# Patient Record
Sex: Female | Born: 1950 | Race: White | Hispanic: No | Marital: Married | Smoking: Never smoker
Health system: Southern US, Community
[De-identification: ages and names within clinical notes are randomized; demographics above are authoritative.]

## PROBLEM LIST (undated history)

## (undated) DIAGNOSIS — J189 Pneumonia, unspecified organism: Secondary | ICD-10-CM

## (undated) DIAGNOSIS — M1990 Unspecified osteoarthritis, unspecified site: Secondary | ICD-10-CM

## (undated) DIAGNOSIS — K219 Gastro-esophageal reflux disease without esophagitis: Secondary | ICD-10-CM

## (undated) DIAGNOSIS — C73 Malignant neoplasm of thyroid gland: Secondary | ICD-10-CM

## (undated) DIAGNOSIS — E7800 Pure hypercholesterolemia, unspecified: Secondary | ICD-10-CM

## (undated) DIAGNOSIS — E119 Type 2 diabetes mellitus without complications: Secondary | ICD-10-CM

## (undated) DIAGNOSIS — E039 Hypothyroidism, unspecified: Secondary | ICD-10-CM

## (undated) DIAGNOSIS — F32A Depression, unspecified: Secondary | ICD-10-CM

## (undated) DIAGNOSIS — I10 Essential (primary) hypertension: Secondary | ICD-10-CM

## (undated) HISTORY — DX: Pure hypercholesterolemia, unspecified: E78.00

## (undated) HISTORY — DX: Essential (primary) hypertension: I10

## (undated) HISTORY — PX: THYROIDECTOMY: SHX17

## (undated) HISTORY — DX: Hypothyroidism, unspecified: E03.9

## (undated) HISTORY — DX: Type 2 diabetes mellitus without complications: E11.9

## (undated) HISTORY — DX: Malignant neoplasm of thyroid gland: C73

## (undated) HISTORY — PX: SEPTOPLASTY: SUR1290

---

## 2006-06-19 ENCOUNTER — Ambulatory Visit: Payer: Self-pay | Admitting: Gastroenterology

## 2011-03-08 HISTORY — PX: COLONOSCOPY: SHX5424

## 2011-07-13 ENCOUNTER — Ambulatory Visit: Payer: Self-pay

## 2011-10-25 ENCOUNTER — Ambulatory Visit: Payer: Self-pay | Admitting: Gastroenterology

## 2015-02-06 LAB — HM PAP SMEAR: HM Pap smear: NEGATIVE

## 2016-03-15 LAB — HM MAMMOGRAPHY

## 2017-03-07 HISTORY — PX: COLONOSCOPY: SHX5424

## 2017-03-08 ENCOUNTER — Encounter: Payer: Self-pay | Admitting: Obstetrics and Gynecology

## 2017-03-09 NOTE — Progress Notes (Signed)
This encounter was created in error - please disregard.

## 2017-03-09 NOTE — Patient Instructions (Signed)
I value your feedback and entrusting us with your care. If you get a [place] patient survey, I would appreciate you taking the time to let us know about your experience today. Thank you! 

## 2017-03-28 ENCOUNTER — Encounter: Payer: Self-pay | Admitting: Obstetrics and Gynecology

## 2017-03-28 ENCOUNTER — Ambulatory Visit (INDEPENDENT_AMBULATORY_CARE_PROVIDER_SITE_OTHER): Payer: Medicare Other | Admitting: Obstetrics and Gynecology

## 2017-03-28 VITALS — BP 120/70 | Ht 66.0 in | Wt 153.0 lb

## 2017-03-28 DIAGNOSIS — Z1239 Encounter for other screening for malignant neoplasm of breast: Secondary | ICD-10-CM

## 2017-03-28 DIAGNOSIS — Z1211 Encounter for screening for malignant neoplasm of colon: Secondary | ICD-10-CM

## 2017-03-28 DIAGNOSIS — Z124 Encounter for screening for malignant neoplasm of cervix: Secondary | ICD-10-CM

## 2017-03-28 DIAGNOSIS — Z01419 Encounter for gynecological examination (general) (routine) without abnormal findings: Secondary | ICD-10-CM | POA: Diagnosis not present

## 2017-03-28 DIAGNOSIS — Z1231 Encounter for screening mammogram for malignant neoplasm of breast: Secondary | ICD-10-CM

## 2017-03-28 NOTE — Patient Instructions (Addendum)
I value your feedback and entrusting us with your care. If you get a [place] patient survey, I would appreciate you taking the time to let us know about your experience today. Thank you!  [facility]: [phone]  [facility]: [phone]  

## 2017-03-28 NOTE — Progress Notes (Signed)
PCP: Patient, No Pcp Per   Chief Complaint  Patient presents with  . Gynecologic Exam    HPI:      Ms. Andrea Buckley is a 67 y.o. [id number] who LMP was No LMP recorded., presents today for her annual examination.  Her menses are absent due to menopause. She does not have intermenstrual bleeding. She does not have vasomotor sx.   Sex activity: not sexually active. She does not have vaginal dryness.  Last Pap: February 04, 2015  Results were: no abnormalities  Hx of STDs: none  Last mammogram: March 15, 2016  Results were: normal--routine follow-up in 12 months There is no FH of breast cancer. There is no FH of ovarian cancer. The patient does not do self-breast exams.  Colonoscopy: colonoscopy 6 years ago with abnormalities.  Repeat due after 5 years. Pt is scheduling this yr with PCP.  Tobacco use: The patient denies current or previous tobacco use. Alcohol use: social drinker Exercise: moderately active  She does get adequate calcium and Vitamin D in her diet.  Labs with PCP.   Past Medical History:  Diagnosis Date  . Diabetes type 2, controlled ([place])   . Hypercholesterolemia   . Hypertension   . Hypothyroidism   . Malignant neoplasm of thyroid gland [facility])     Past Surgical History:  Procedure Laterality Date  . COLONOSCOPY  2013   repeat due in 5 yrs  . SEPTOPLASTY      Family History  Problem Relation Age of Onset  . Heart disease Mother   . Diabetes Father   . Diabetes Sister   . Diabetes Brother     Social History   Socioeconomic History  . Marital status: Married    Spouse name: Not on file  . Number of children: Not on file  . Years of education: Not on file  . Highest education level: Not on file  Social Needs  . Financial resource strain: Not on file  . Food insecurity - worry: Not on file  . Food insecurity - inability: Not on file  . Transportation needs - medical: Not on file  . Transportation needs - non-medical: Not on file    Occupational History  . Not on file  Tobacco Use  . Smoking status: Never Smoker  . Smokeless tobacco: Never Used  Substance and Sexual Activity  . Alcohol use: Yes    Frequency: Never  . Drug use: No  . Sexual activity: Not Currently  Other Topics Concern  . Not on file  Social History Narrative  . Not on file    Current Meds  Medication Sig  . cholecalciferol (VITAMIN D) 1000 units tablet Take 1,000 Units by mouth daily.  Marland Kitchen levothyroxine (SYNTHROID, LEVOTHROID) 100 MCG tablet Take 100 mcg by mouth daily before breakfast.  . metFORMIN (GLUCOPHAGE) 1000 MG tablet Take 1,000 mg by mouth 2 (two) times daily with a meal.      ROS:  Review of Systems  Constitutional: Negative for fatigue, fever and unexpected weight change.  Respiratory: Negative for cough, shortness of breath and wheezing.   Cardiovascular: Negative for chest pain, palpitations and leg swelling.  Gastrointestinal: Negative for blood in stool, constipation, diarrhea, nausea and vomiting.  Endocrine: Negative for cold intolerance, heat intolerance and polyuria.  Genitourinary: Negative for dyspareunia, dysuria, flank pain, frequency, genital sores, hematuria, menstrual problem, pelvic pain, urgency, vaginal bleeding, vaginal discharge and vaginal pain.  Musculoskeletal: Negative for back pain, joint swelling and myalgias.  Skin: Negative for rash.  Neurological: Negative for dizziness, syncope, light-headedness, numbness and headaches.  Hematological: Negative for adenopathy.  Psychiatric/Behavioral: Negative for agitation, confusion, sleep disturbance and suicidal ideas. The patient is not nervous/anxious.      Objective: BP 120/70   Ht 5\' 6"  (1.676 m)   Wt 153 lb (69.4 kg)   BMI 24.69 kg/m    Physical Exam  Constitutional: She is oriented to person, place, and time. She appears well-developed and well-nourished.  Genitourinary: Vagina normal and uterus normal. There is no rash or tenderness on the  right labia. There is no rash or tenderness on the left labia. No erythema or tenderness in the vagina. No vaginal discharge found. Right adnexum does not display mass and does not display tenderness. Left adnexum does not display mass and does not display tenderness. Cervix does not exhibit motion tenderness or polyp. Uterus is not enlarged or tender.  Neck: Normal range of motion. No thyromegaly present.  Cardiovascular: Normal rate, regular rhythm and normal heart sounds.  No murmur heard. Pulmonary/Chest: Effort normal and breath sounds normal. Right breast exhibits no mass, no nipple discharge, no skin change and no tenderness. Left breast exhibits no mass, no nipple discharge, no skin change and no tenderness.  Abdominal: Soft. There is no tenderness. There is no guarding.  Musculoskeletal: Normal range of motion.  Neurological: She is alert and oriented to person, place, and time. No cranial nerve deficit.  Psychiatric: She has a normal mood and affect. Her behavior is normal.  Vitals reviewed.   Assessment/Plan:  Encounter for annual routine gynecological examination  Cervical cancer screening - Plan: Pap IG (Image Guided)  Screening for breast cancer - Pt to sched mammo. - Plan: MM DIGITAL SCREENING BILATERAL  Screening for colon cancer - Pt sched scr colonoscopy through PCP.         GYN counsel breast self exam, mammography screening, menopause, adequate intake of calcium and vitamin D, diet and exercise    F/U  Return in about 2 years (around 03/29/2019).  Cyndal Kasson B. Marlisa Caridi, PA-C 03/28/2017 9:02 AM

## 2017-03-30 LAB — PAP IG (IMAGE GUIDED): PAP Smear Comment: 0

## 2017-06-19 ENCOUNTER — Encounter: Payer: Self-pay | Admitting: Obstetrics and Gynecology

## 2017-06-27 ENCOUNTER — Encounter: Payer: Self-pay | Admitting: Obstetrics and Gynecology

## 2018-03-07 DIAGNOSIS — U071 COVID-19: Secondary | ICD-10-CM

## 2018-03-07 HISTORY — DX: COVID-19: U07.1

## 2019-04-24 ENCOUNTER — Other Ambulatory Visit: Payer: Self-pay | Admitting: Family Medicine

## 2019-04-24 DIAGNOSIS — Z1231 Encounter for screening mammogram for malignant neoplasm of breast: Secondary | ICD-10-CM

## 2019-05-22 NOTE — Progress Notes (Signed)
PCP: Patient, No Pcp Per   Chief Complaint  Patient presents with  . Gynecologic Exam    HPI:      Ms. Andrea Buckley is a 69 y.o. [id number] who LMP was No LMP recorded., presents today for her annual examination.  Her menses are absent due to menopause. She does not have intermenstrual bleeding. She does not have vasomotor sx.   Sex activity: rarely sexually active with dyspareunia. She does not have vaginal sx otherwise.  Last Pap: 03/28/17  Results were: no abnormalities  Hx of STDs: none  Last mammogram: 05/26/17  Results were: normal--routine follow-up in 12 months. Has upcoming appt tomorrow. There is no FH of breast cancer. There is no FH of ovarian cancer. The patient does not do self-breast exams.  Colonoscopy: colonoscopy 2019, Repeat due after 5 years per pt report.   Tobacco use: The patient denies current or previous tobacco use. Alcohol use: social drinker  No drug use Exercise: moderately active  She does get adequate calcium and Vitamin D in her diet.  Labs with PCP.   Past Medical History:  Diagnosis Date  . Diabetes type 2, controlled ([place])   . Hypercholesterolemia   . Hypertension   . Hypothyroidism   . Malignant neoplasm of thyroid gland [facility])     Past Surgical History:  Procedure Laterality Date  . COLONOSCOPY  2019   repeat due in 5 yrs  . SEPTOPLASTY      Family History  Problem Relation Age of Onset  . Heart disease Mother   . Diabetes Father   . Diabetes Sister   . Diabetes Brother     Social History   Socioeconomic History  . Marital status: Married    Spouse name: Not on file  . Number of children: Not on file  . Years of education: Not on file  . Highest education level: Not on file  Occupational History  . Not on file  Tobacco Use  . Smoking status: Never Smoker  . Smokeless tobacco: Never Used  Substance and Sexual Activity  . Alcohol use: Yes  . Drug use: No  . Sexual activity: Yes  Other Topics Concern  . Not on  file  Social History Narrative  . Not on file   Social Determinants of Health   Financial Resource Strain:   . Difficulty of Paying Living Expenses:   Food Insecurity:   . Worried About Charity fundraiser in the Last Year:   . Arboriculturist in the Last Year:   Transportation Needs:   . Film/video editor (Medical):   Marland Kitchen Lack of Transportation (Non-Medical):   Physical Activity:   . Days of Exercise per Week:   . Minutes of Exercise per Session:   Stress:   . Feeling of Stress :   Social Connections:   . Frequency of Communication with Friends and Family:   . Frequency of Social Gatherings with Friends and Family:   . Attends Religious Services:   . Active Member of Clubs or Organizations:   . Attends Archivist Meetings:   Marland Kitchen Marital Status:   Intimate Partner Violence:   . Fear of Current or Ex-Partner:   . Emotionally Abused:   Marland Kitchen Physically Abused:   . Sexually Abused:     Current Meds  Medication Sig  . ascorbic acid (VITAMIN C) 100 MG tablet Take by mouth.  Marland Kitchen BIOTIN PO Take by mouth.  . Blood Glucose Monitoring Suppl (  ACCU-CHEK AVIVA PLUS) w/Device KIT Use to test blood sugar  . cholecalciferol (VITAMIN D) 1000 units tablet Take 1,000 Units by mouth daily.  . Coenzyme Q10 (COQ10 PO) Take by mouth.  . cyanocobalamin 1000 MCG tablet Take by mouth.  . levothyroxine (SYNTHROID, LEVOTHROID) 100 MCG tablet Take 100 mcg by mouth daily before breakfast.  . lisinopril (ZESTRIL) 10 MG tablet Take by mouth.  . metFORMIN (GLUCOPHAGE) 1000 MG tablet Take 1,000 mg by mouth 2 (two) times daily with a meal.  . Multiple Vitamin (MULTI-VITAMIN) tablet Take by mouth.  . rosuvastatin (CRESTOR) 10 MG tablet Take by mouth.      ROS:  Review of Systems  Constitutional: Negative for fatigue, fever and unexpected weight change.  Respiratory: Negative for cough, shortness of breath and wheezing.   Cardiovascular: Negative for chest pain, palpitations and leg swelling.    Gastrointestinal: Positive for diarrhea. Negative for blood in stool, constipation, nausea and vomiting.  Endocrine: Negative for cold intolerance, heat intolerance and polyuria.  Genitourinary: Positive for dyspareunia. Negative for dysuria, flank pain, frequency, genital sores, hematuria, menstrual problem, pelvic pain, urgency, vaginal bleeding, vaginal discharge and vaginal pain.  Musculoskeletal: Negative for back pain, joint swelling and myalgias.  Skin: Negative for rash.  Neurological: Negative for dizziness, syncope, light-headedness, numbness and headaches.  Hematological: Negative for adenopathy.  Psychiatric/Behavioral: Negative for agitation, confusion, sleep disturbance and suicidal ideas. The patient is not nervous/anxious.      Objective: BP 130/70   Ht 5' 6"  (1.676 m)   Wt 158 lb (71.7 kg)   BMI 25.50 kg/m    Physical Exam Constitutional:      Appearance: She is well-developed.  Genitourinary:     Vulva, vagina, uterus, right adnexa and left adnexa normal.     No vulval lesion or tenderness noted.     Vaginal atrophic mucosa present.     No vaginal discharge, erythema or tenderness.     No cervical motion tenderness or polyp.     Uterus is not enlarged or tender.     No right or left adnexal mass present.     Right adnexa not tender.     Left adnexa not tender.  Neck:     Thyroid: No thyromegaly.  Cardiovascular:     Rate and Rhythm: Normal rate and regular rhythm.     Heart sounds: Normal heart sounds. No murmur.  Pulmonary:     Effort: Pulmonary effort is normal.     Breath sounds: Normal breath sounds.  Chest:     Breasts:        Right: No mass, nipple discharge, skin change or tenderness.        Left: No mass, nipple discharge, skin change or tenderness.  Abdominal:     Palpations: Abdomen is soft.     Tenderness: There is no abdominal tenderness. There is no guarding.  Musculoskeletal:        General: Normal range of motion.     Cervical back:  Normal range of motion.  Neurological:     General: No focal deficit present.     Mental Status: She is alert and oriented to person, place, and time.     Cranial Nerves: No cranial nerve deficit.  Skin:    General: Skin is warm and dry.  Psychiatric:        Mood and Affect: Mood normal.        Behavior: Behavior normal.        Thought Content:  Thought content normal.        Judgment: Judgment normal.  Vitals reviewed.     Assessment/Plan:  Encounter for annual routine gynecological examination  Cervical cancer screening - Plan: Cytology - PAP  Encounter for screening mammogram for malignant neoplasm of breast; pt has appt tomorrow  Screening for colon cancer--UTD on colonoscopy.         GYN counsel breast self exam, mammography screening, menopause, adequate intake of calcium and vitamin D, diet and exercise    F/U  Return in about 2 years (around 05/22/2021). if pt desires; prn.  Pernell Lenoir B. Kemani Heidel, PA-C 05/23/2019 11:21 AM

## 2019-05-23 ENCOUNTER — Encounter: Payer: Self-pay | Admitting: Obstetrics and Gynecology

## 2019-05-23 ENCOUNTER — Other Ambulatory Visit: Payer: Self-pay

## 2019-05-23 ENCOUNTER — Other Ambulatory Visit (HOSPITAL_COMMUNITY)
Admission: RE | Admit: 2019-05-23 | Discharge: 2019-05-23 | Disposition: A | Discharge status: Home / Self Care | Payer: Medicare HMO | Source: Ambulatory Visit | Attending: Obstetrics and Gynecology | Admitting: Obstetrics and Gynecology

## 2019-05-23 ENCOUNTER — Ambulatory Visit (INDEPENDENT_AMBULATORY_CARE_PROVIDER_SITE_OTHER): Payer: Medicare HMO | Admitting: Obstetrics and Gynecology

## 2019-05-23 VITALS — BP 130/70 | Ht 66.0 in | Wt 158.0 lb

## 2019-05-23 DIAGNOSIS — Z1211 Encounter for screening for malignant neoplasm of colon: Secondary | ICD-10-CM

## 2019-05-23 DIAGNOSIS — Z124 Encounter for screening for malignant neoplasm of cervix: Secondary | ICD-10-CM

## 2019-05-23 DIAGNOSIS — Z01419 Encounter for gynecological examination (general) (routine) without abnormal findings: Secondary | ICD-10-CM

## 2019-05-23 DIAGNOSIS — Z1231 Encounter for screening mammogram for malignant neoplasm of breast: Secondary | ICD-10-CM

## 2019-05-23 NOTE — Patient Instructions (Signed)
I value your feedback and entrusting us with your care. If you get a [place] patient survey, I would appreciate you taking the time to let us know about your experience today. Thank you!  As of February 14, 2019, your lab results will be released to your MyChart immediately, before I even have a chance to see them. Please give me time to review them and contact you if there are any abnormalities. Thank you for your patience.  

## 2019-05-24 ENCOUNTER — Ambulatory Visit
Admission: RE | Admit: 2019-05-24 | Discharge: 2019-05-24 | Disposition: A | Discharge status: Home / Self Care | Payer: Medicare HMO | Source: Ambulatory Visit | Attending: Family Medicine | Admitting: Family Medicine

## 2019-05-24 DIAGNOSIS — Z1231 Encounter for screening mammogram for malignant neoplasm of breast: Secondary | ICD-10-CM | POA: Insufficient documentation

## 2019-05-27 LAB — CYTOLOGY - PAP: Diagnosis: NEGATIVE

## 2020-02-26 ENCOUNTER — Other Ambulatory Visit: Payer: Self-pay | Admitting: Physician Assistant

## 2020-02-26 DIAGNOSIS — M12812 Other specific arthropathies, not elsewhere classified, left shoulder: Secondary | ICD-10-CM

## 2020-02-26 DIAGNOSIS — M75102 Unspecified rotator cuff tear or rupture of left shoulder, not specified as traumatic: Secondary | ICD-10-CM

## 2020-02-26 DIAGNOSIS — M7502 Adhesive capsulitis of left shoulder: Secondary | ICD-10-CM

## 2020-03-31 ENCOUNTER — Ambulatory Visit: Payer: Medicare HMO

## 2020-03-31 ENCOUNTER — Ambulatory Visit
Admission: RE | Admit: 2020-03-31 | Discharge: 2020-03-31 | Disposition: A | Discharge status: Home / Self Care | Payer: Medicare HMO | Source: Ambulatory Visit | Attending: Physician Assistant | Admitting: Physician Assistant

## 2020-03-31 ENCOUNTER — Other Ambulatory Visit: Payer: Self-pay

## 2020-03-31 DIAGNOSIS — M12812 Other specific arthropathies, not elsewhere classified, left shoulder: Secondary | ICD-10-CM | POA: Diagnosis present

## 2020-03-31 DIAGNOSIS — M75102 Unspecified rotator cuff tear or rupture of left shoulder, not specified as traumatic: Secondary | ICD-10-CM

## 2020-03-31 DIAGNOSIS — M7502 Adhesive capsulitis of left shoulder: Secondary | ICD-10-CM | POA: Diagnosis present

## 2020-06-08 ENCOUNTER — Telehealth: Payer: Self-pay

## 2020-06-08 DIAGNOSIS — Z1231 Encounter for screening mammogram for malignant neoplasm of breast: Secondary | ICD-10-CM

## 2020-06-08 NOTE — Addendum Note (Signed)
Addended by: Ardeth Perfect B on: 10/11[birth year]1957 03:55 PM   Modules accepted: Orders

## 2020-06-08 NOTE — Telephone Encounter (Signed)
Pt calling; states [facility] said it was a dx mammogram; pt is confused - doesn't think there is a problem; maybe she doesn't need a mammogram like she doesn't need a pap anymore d/t age.  [phone]

## 2020-06-08 NOTE — Telephone Encounter (Signed)
Sry, I put in wrong order. Fixed now, pls notify pt and apologize for my error. Thx.

## 2020-06-08 NOTE — Telephone Encounter (Signed)
Order placed. Pls notify pt.

## 2020-06-08 NOTE — Telephone Encounter (Signed)
Pt aware.

## 2020-06-08 NOTE — Telephone Encounter (Signed)
This encounter was created in error - please disregard.

## 2020-06-08 NOTE — Telephone Encounter (Signed)
Pt calling; needs mammogram; on AVS recv'd from [facility] it says mammogram screening digital bilateral expires 4/16.  Pt calling to get scheduled.  [phone]

## 2020-07-02 ENCOUNTER — Other Ambulatory Visit: Payer: Self-pay | Admitting: Surgery

## 2020-07-15 ENCOUNTER — Encounter
Admission: RE | Admit: 2020-07-15 | Discharge: 2020-07-15 | Disposition: A | Discharge status: Home / Self Care | Payer: Medicare HMO | Source: Ambulatory Visit | Attending: Surgery | Admitting: Surgery

## 2020-07-15 ENCOUNTER — Other Ambulatory Visit: Payer: Self-pay

## 2020-07-15 DIAGNOSIS — Z01818 Encounter for other preprocedural examination: Secondary | ICD-10-CM | POA: Insufficient documentation

## 2020-07-15 HISTORY — DX: Unspecified osteoarthritis, unspecified site: M19.90

## 2020-07-15 HISTORY — DX: Gastro-esophageal reflux disease without esophagitis: K21.9

## 2020-07-15 NOTE — Patient Instructions (Signed)
Your procedure is scheduled on:07-23-20 THURSDAY Report to the Registration Desk on the 1st floor of the [facility]-Then proceed to the 2nd floor Surgery Desk in the [place] To find out your arrival time, please call [phone] between 1PM - 3PM on:07-22-20 WEDNESDAY  REMEMBER: Instructions that are not followed completely may result in serious medical risk, up to and including death; or upon the discretion of your surgeon and anesthesiologist your surgery may need to be rescheduled.  Do not eat food after midnight the night before surgery.  No gum chewing, lozengers or hard candies.  You may however, drink WATER up to 2 hours before you are scheduled to arrive for your surgery. Do not drink anything within 2 hours of your scheduled arrival time.  Type 1 and Type 2 diabetics should only drink water  TAKE THESE MEDICATIONS THE MORNING OF SURGERY WITH A SIP OF WATER: -SYNTHROID (LEVOTHYROXINE)  Stop Metformin 2 days prior to surgery-LAST DOSE ON 07-20-20 MONDAY  One week prior to surgery: Stop Anti-inflammatories (NSAIDS) such as Advil, Aleve, Ibuprofen, Motrin, Naproxen, Naprosyn and Aspirin based products such as Excedrin, Goodys Powder, BC Powder-OK TO TAKE TYLENOL IF NEEDED   Stop ANY OVER THE COUNTER supplements/vitamins NOW (07-15-20) until after surgery.  No Alcohol for 24 hours before or after surgery.  No Smoking including e-cigarettes for 24 hours prior to surgery.  No chewable tobacco products for at least 6 hours prior to surgery.  No nicotine patches on the day of surgery.  Do not use any "recreational" drugs for at least a week prior to your surgery.  Please be advised that the combination of cocaine and anesthesia may have negative outcomes, up to and including death. If you test positive for cocaine, your surgery will be cancelled.  On the morning of surgery brush your teeth with toothpaste and water, you may rinse your mouth with mouthwash if you wish. Do not  swallow any toothpaste or mouthwash.  Do not wear jewelry, make-up, hairpins, clips or nail polish.  Do not wear lotions, powders, or perfumes.   Do not shave body from the neck down 48 hours prior to surgery just in case you cut yourself which could leave a site for infection.  Also, freshly shaved skin may become irritated if using the CHG soap.  Contact lenses, hearing aids and dentures may not be worn into surgery.  Do not bring valuables to the hospital. [facility] is not responsible for any missing/lost belongings or valuables.   Use CHG Soap as directed on instruction sheet  Notify your doctor if there is any change in your medical condition (cold, fever, infection).  Wear comfortable clothing (specific to your surgery type) to the hospital.  Plan for stool softeners for home use; pain medications have a tendency to cause constipation. You can also help prevent constipation by eating foods high in fiber such as fruits and vegetables and drinking plenty of fluids as your diet allows.  After surgery, you can help prevent lung complications by doing breathing exercises.  Take deep breaths and cough every 1-2 hours. Your doctor may order a device called an Incentive Spirometer to help you take deep breaths. When coughing or sneezing, hold a pillow firmly against your incision with both hands. This is called "splinting." Doing this helps protect your incision. It also decreases belly discomfort.  If you are being admitted to the hospital overnight, leave your suitcase in the car. After surgery it may be brought to your room.  If you are being discharged the day of surgery, you will not be allowed to drive home. You will need a responsible adult (18 years or older) to drive you home and stay with you that night.   If you are taking public transportation, you will need to have a responsible adult (18 years or older) with you. Please confirm with your physician that it is acceptable  to use public transportation.   Please call the [place] Dept. at [phone] if you have any questions about these instructions.  Surgery Visitation Policy:  Patients undergoing a surgery or procedure may have one family member or support person with them as long as that person is not COVID-19 positive or experiencing its symptoms.  That person may remain in the waiting area during the procedure.  Inpatient Visitation:    Visiting hours are 7 a.m. to 8 p.m. Inpatients will be allowed two visitors daily. The visitors may change each day during the patient's stay. No visitors under the age of 74. Any visitor under the age of 7 must be accompanied by an adult. The visitor must pass COVID-19 screenings, use hand sanitizer when entering and exiting the patient's room and wear a mask at all times, including in the patient's room. Patients must also wear a mask when staff or their visitor are in the room. Masking is required regardless of vaccination status.

## 2020-07-16 ENCOUNTER — Encounter
Admission: RE | Admit: 2020-07-16 | Discharge: 2020-07-16 | Disposition: A | Discharge status: Home / Self Care | Payer: Medicare HMO | Source: Ambulatory Visit | Attending: Surgery | Admitting: Surgery

## 2020-07-16 DIAGNOSIS — Z01818 Encounter for other preprocedural examination: Secondary | ICD-10-CM | POA: Diagnosis not present

## 2020-07-16 DIAGNOSIS — I10 Essential (primary) hypertension: Secondary | ICD-10-CM

## 2020-07-16 LAB — HEMOGLOBIN A1C
Hgb A1c MFr Bld: 8.4 % — ABNORMAL HIGH (ref 4.8–5.6)
Mean Plasma Glucose: 194.38 mg/dL

## 2020-07-17 NOTE — Progress Notes (Signed)
  Perioperative Services: Pre-Admission/Anesthesia Testing  Abnormal Lab Notification    Date: 07/17/20  Name: Andrea Buckley MRN:   [id number]  Re: Abnormal labs noted during PAT appointment   Provider(s) Notified: Milagros Evener, MD Notification mode: Routed and/or faxed via [facility]   ABNORMAL LAB VALUE(S): Lab Results  Component Value Date   HGBA1C 8.4 (H) 07/16/2020   Notes: Patient with a T2DM diagnosis. She is currently metformin monotherpy. This communication is being sent in order to determine if patient is deemed to have adequate preoperative glycemic control in efforts to reduce her risk of developing SSI.  Marland Kitchen The odds ratio for SSI/PJI infection is between 2.8 and 3.4 for orthopedic surgery patients with pre-operative serum glucose levels of > 125 mg/dL or a post-operative levels of > 200 mg/dL ([place], 2019).    . Data suggests that a Hgb A1c threshold of 7.7% tends to be more indicative of infection than the commonly used 7% and should perhaps be the pre-operative patient optimization goal (Tarabichi et al., 2017).   With that being said, the benefit of improving glycemic control must be weighed against the overall risk associated with delaying a necessary elective orthopedic surgery for this patient. This is a Community education officer; no formal response is required.  Citations: Charlett Blake, A.F. Reducing the risk of infection after total joint arthroplasty: preoperative optimization. Arthroplasty 1, 4 (2019). [email or web address]  Lorrin Goodell MM, [place], Brigati D, [place], [address] [place] JC, [place], [place], [place], [place]. Determining the Threshold for HbA1c as a Predictor for Adverse Outcomes After Total Joint Arthroplasty: A Multicenter, Retrospective Study. J Arthroplasty. 2017 Sep;32(9S):S263-S267.e1. [email or web address].   Honor Loh, MSN, APRN, FNP-C, CEN [facility]  Peri-operative Services Nurse Practitioner Phone: [phone] 07/17/20 8:14 AM

## 2020-07-21 ENCOUNTER — Other Ambulatory Visit: Payer: Medicare HMO

## 2020-07-22 ENCOUNTER — Encounter: Payer: Self-pay | Admitting: Obstetrics and Gynecology

## 2020-07-22 ENCOUNTER — Ambulatory Visit
Admission: RE | Admit: 2020-07-22 | Discharge: 2020-07-22 | Disposition: A | Discharge status: Home / Self Care | Payer: Medicare HMO | Source: Ambulatory Visit | Attending: Obstetrics and Gynecology | Admitting: Obstetrics and Gynecology

## 2020-07-22 ENCOUNTER — Other Ambulatory Visit: Payer: Self-pay

## 2020-07-22 DIAGNOSIS — Z1231 Encounter for screening mammogram for malignant neoplasm of breast: Secondary | ICD-10-CM | POA: Diagnosis not present

## 2020-07-23 ENCOUNTER — Other Ambulatory Visit: Payer: Self-pay

## 2020-07-23 ENCOUNTER — Ambulatory Visit
Admission: RE | Admit: 2020-07-23 | Discharge: 2020-07-23 | Disposition: A | Discharge status: Home / Self Care | Payer: Medicare HMO | Attending: Surgery | Admitting: Surgery

## 2020-07-23 ENCOUNTER — Ambulatory Visit: Payer: Medicare HMO | Admitting: Anesthesiology

## 2020-07-23 ENCOUNTER — Encounter: Payer: Self-pay | Admitting: Surgery

## 2020-07-23 ENCOUNTER — Encounter
Admission: RE | Disposition: A | Discharge status: Home / Self Care | Payer: Self-pay | Source: Home / Self Care | Attending: Surgery

## 2020-07-23 ENCOUNTER — Ambulatory Visit: Payer: Medicare HMO

## 2020-07-23 DIAGNOSIS — Z7984 Long term (current) use of oral hypoglycemic drugs: Secondary | ICD-10-CM | POA: Insufficient documentation

## 2020-07-23 DIAGNOSIS — Z79890 Hormone replacement therapy (postmenopausal): Secondary | ICD-10-CM | POA: Diagnosis not present

## 2020-07-23 DIAGNOSIS — M75112 Incomplete rotator cuff tear or rupture of left shoulder, not specified as traumatic: Secondary | ICD-10-CM | POA: Diagnosis not present

## 2020-07-23 DIAGNOSIS — M67814 Other specified disorders of tendon, left shoulder: Secondary | ICD-10-CM | POA: Insufficient documentation

## 2020-07-23 DIAGNOSIS — M7502 Adhesive capsulitis of left shoulder: Secondary | ICD-10-CM | POA: Insufficient documentation

## 2020-07-23 DIAGNOSIS — M25512 Pain in left shoulder: Secondary | ICD-10-CM

## 2020-07-23 DIAGNOSIS — Z79899 Other long term (current) drug therapy: Secondary | ICD-10-CM | POA: Insufficient documentation

## 2020-07-23 DIAGNOSIS — Z85850 Personal history of malignant neoplasm of thyroid: Secondary | ICD-10-CM | POA: Diagnosis not present

## 2020-07-23 DIAGNOSIS — M7542 Impingement syndrome of left shoulder: Secondary | ICD-10-CM | POA: Diagnosis not present

## 2020-07-23 HISTORY — PX: SHOULDER ARTHROSCOPY: SHX128

## 2020-07-23 LAB — GLUCOSE, CAPILLARY
Glucose-Capillary: 142 mg/dL — ABNORMAL HIGH (ref 70–99)
Glucose-Capillary: 167 mg/dL — ABNORMAL HIGH (ref 70–99)

## 2020-07-23 SURGERY — ARTHROSCOPY, SHOULDER
Anesthesia: General | Site: Shoulder | Laterality: Left

## 2020-07-23 MED ORDER — SUGAMMADEX SODIUM 200 MG/2ML IV SOLN
INTRAVENOUS | Status: DC | PRN
  Administered 2020-07-23: 500 mg via INTRAVENOUS

## 2020-07-23 MED ORDER — SODIUM CHLORIDE 0.9 % IV SOLN: INTRAVENOUS | Status: DC

## 2020-07-23 MED ORDER — POTASSIUM CHLORIDE IN NACL 20-0.9 MEQ/L-% IV SOLN: INTRAVENOUS | Status: DC

## 2020-07-23 MED ORDER — PHENYLEPHRINE HCL (PRESSORS) 10 MG/ML IV SOLN
INTRAVENOUS | Status: DC | PRN
  Administered 2020-07-23 (×2): 100 ug via INTRAVENOUS

## 2020-07-23 MED ORDER — FENTANYL CITRATE (PF) 100 MCG/2ML IJ SOLN: 50.0000 ug | Freq: Once | INTRAMUSCULAR | Status: DC

## 2020-07-23 MED ORDER — ONDANSETRON HCL 4 MG/2ML IJ SOLN
INTRAMUSCULAR | Status: AC
  Filled 2020-07-23: qty 2

## 2020-07-23 MED ORDER — DEXAMETHASONE SODIUM PHOSPHATE 10 MG/ML IJ SOLN
INTRAMUSCULAR | Status: AC
  Filled 2020-07-23: qty 1

## 2020-07-23 MED ORDER — EPINEPHRINE PF 1 MG/ML IJ SOLN
INTRAMUSCULAR | Status: AC
  Filled 2020-07-23: qty 1

## 2020-07-23 MED ORDER — METOCLOPRAMIDE HCL 10 MG PO TABS: 5.0000 mg | ORAL_TABLET | Freq: Three times a day (TID) | ORAL | Status: DC | PRN

## 2020-07-23 MED ORDER — ONDANSETRON HCL 4 MG/2ML IJ SOLN
INTRAMUSCULAR | Status: DC | PRN
  Administered 2020-07-23: 4 mg via INTRAVENOUS

## 2020-07-23 MED ORDER — MIDAZOLAM HCL 2 MG/2ML IJ SOLN: 1.0000 mg | Freq: Once | INTRAMUSCULAR | Status: DC

## 2020-07-23 MED ORDER — ONDANSETRON HCL 4 MG PO TABS: 4.0000 mg | ORAL_TABLET | Freq: Four times a day (QID) | ORAL | Status: DC | PRN

## 2020-07-23 MED ORDER — BUPIVACAINE LIPOSOME 1.3 % IJ SUSP
INTRAMUSCULAR | Status: DC | PRN
  Administered 2020-07-23: 20 mL

## 2020-07-23 MED ORDER — BUPIVACAINE-EPINEPHRINE (PF) 0.5% -1:200000 IJ SOLN
INTRAMUSCULAR | Status: AC
  Filled 2020-07-23: qty 30

## 2020-07-23 MED ORDER — ONDANSETRON HCL 4 MG/2ML IJ SOLN: 4.0000 mg | Freq: Four times a day (QID) | INTRAMUSCULAR | Status: DC | PRN

## 2020-07-23 MED ORDER — DEXAMETHASONE SODIUM PHOSPHATE 10 MG/ML IJ SOLN
INTRAMUSCULAR | Status: DC | PRN
  Administered 2020-07-23: 5 mg via INTRAVENOUS

## 2020-07-23 MED ORDER — BUPIVACAINE HCL (PF) 0.5 % IJ SOLN
INTRAMUSCULAR | Status: DC | PRN
  Administered 2020-07-23: 10 mL

## 2020-07-23 MED ORDER — FENTANYL CITRATE (PF) 100 MCG/2ML IJ SOLN
INTRAMUSCULAR | Status: DC | PRN
  Administered 2020-07-23: 25 ug via INTRAVENOUS

## 2020-07-23 MED ORDER — CEFAZOLIN SODIUM-DEXTROSE 2-4 GM/100ML-% IV SOLN
2.0000 g | INTRAVENOUS | Status: AC
  Administered 2020-07-23: 2 g via INTRAVENOUS

## 2020-07-23 MED ORDER — FENTANYL CITRATE (PF) 100 MCG/2ML IJ SOLN
INTRAMUSCULAR | Status: AC
  Filled 2020-07-23: qty 2

## 2020-07-23 MED ORDER — SODIUM CHLORIDE 0.9 % IV SOLN
INTRAVENOUS | Status: DC | PRN
  Administered 2020-07-23: 40 ug/min via INTRAVENOUS

## 2020-07-23 MED ORDER — METOCLOPRAMIDE HCL 5 MG/ML IJ SOLN
5.0000 mg | Freq: Three times a day (TID) | INTRAMUSCULAR | Status: DC | PRN
Start: 2020-07-23 — End: 2020-07-23

## 2020-07-23 MED ORDER — PROPOFOL 10 MG/ML IV BOLUS
INTRAVENOUS | Status: DC | PRN
  Administered 2020-07-23: 25 mg via INTRAVENOUS
  Administered 2020-07-23: 50 mg via INTRAVENOUS

## 2020-07-23 MED ORDER — OXYCODONE HCL 5 MG PO TABS: 5.0000 mg | ORAL_TABLET | ORAL | 0 refills | Status: DC | PRN

## 2020-07-23 MED ORDER — ROCURONIUM BROMIDE 100 MG/10ML IV SOLN
INTRAVENOUS | Status: DC | PRN
  Administered 2020-07-23: 40 mg via INTRAVENOUS
  Administered 2020-07-23: 20 mg via INTRAVENOUS

## 2020-07-23 MED ORDER — ORAL CARE MOUTH RINSE: 15.0000 mL | Freq: Once | OROMUCOSAL | Status: DC

## 2020-07-23 MED ORDER — LIDOCAINE HCL (PF) 1 % IJ SOLN
INTRAMUSCULAR | Status: AC
  Filled 2020-07-23: qty 5

## 2020-07-23 MED ORDER — BUPIVACAINE LIPOSOME 1.3 % IJ SUSP
INTRAMUSCULAR | Status: AC
  Filled 2020-07-23: qty 20

## 2020-07-23 MED ORDER — CHLORHEXIDINE GLUCONATE 0.12 % MT SOLN: 15.0000 mL | Freq: Once | OROMUCOSAL | Status: DC

## 2020-07-23 MED ORDER — CEFAZOLIN SODIUM-DEXTROSE 2-4 GM/100ML-% IV SOLN
INTRAVENOUS | Status: AC
  Filled 2020-07-23: qty 100

## 2020-07-23 MED ORDER — MIDAZOLAM HCL 2 MG/2ML IJ SOLN
INTRAMUSCULAR | Status: AC
  Filled 2020-07-23: qty 2

## 2020-07-23 MED ORDER — ACETAMINOPHEN 10 MG/ML IV SOLN
INTRAVENOUS | Status: DC | PRN
  Administered 2020-07-23: 1000 mg via INTRAVENOUS

## 2020-07-23 MED ORDER — BUPIVACAINE HCL (PF) 0.5 % IJ SOLN
INTRAMUSCULAR | Status: AC
  Filled 2020-07-23: qty 10

## 2020-07-23 MED ORDER — LIDOCAINE HCL (CARDIAC) PF 100 MG/5ML IV SOSY
PREFILLED_SYRINGE | INTRAVENOUS | Status: DC | PRN
  Administered 2020-07-23: 60 mg via INTRAVENOUS

## 2020-07-23 MED ORDER — BUPIVACAINE-EPINEPHRINE (PF) 0.5% -1:200000 IJ SOLN
INTRAMUSCULAR | Status: DC | PRN
  Administered 2020-07-23: 30 mL

## 2020-07-23 MED ORDER — EPHEDRINE SULFATE 50 MG/ML IJ SOLN
INTRAMUSCULAR | Status: DC | PRN
  Administered 2020-07-23 (×2): 5 mg via INTRAVENOUS

## 2020-07-23 MED ORDER — OXYCODONE HCL 5 MG PO TABS
5.0000 mg | ORAL_TABLET | ORAL | Status: DC | PRN
Start: 2020-07-23 — End: 2020-07-23

## 2020-07-23 SURGICAL SUPPLY — 50 items
ANCH SUT 2 2.9 2 LD TPR NDL (Anchor) ×1 IMPLANT
ANCH SUT BN ASCP DLV (Anchor) ×1 IMPLANT
ANCH SUT RGNRT REGENETEN (Staple) ×1 IMPLANT
ANCHOR BONE REGENETEN (Anchor) ×2 IMPLANT
ANCHOR JUGGERKNOT WTAP NDL 2.9 (Anchor) ×2 IMPLANT
ANCHOR TENDON REGENETEN (Staple) ×2 IMPLANT
APL PRP STRL LF DISP 70% ISPRP (MISCELLANEOUS) ×1
BIT DRILL JUGRKNT W/NDL BIT2.9 (DRILL) ×1 IMPLANT
BLADE FULL RADIUS 3.5 (BLADE) ×2 IMPLANT
BUR ACROMIONIZER 4.0 (BURR) ×2 IMPLANT
CANNULA SHAVER 8MMX76MM (CANNULA) ×2 IMPLANT
CHLORAPREP W/TINT 26 (MISCELLANEOUS) ×2 IMPLANT
COVER MAYO STAND REUSABLE (DRAPES) ×2 IMPLANT
COVER WAND RF STERILE (DRAPES) ×2 IMPLANT
DRAPE IMP U-DRAPE 54X76 (DRAPES) ×4 IMPLANT
DRILL JUGGERKNOT W/NDL BIT 2.9 (DRILL) ×2
ELECT REM PT RETURN 9FT ADLT (ELECTROSURGICAL) ×2
ELECTRODE REM PT RTRN 9FT ADLT (ELECTROSURGICAL) ×1 IMPLANT
GAUZE SPONGE 4X4 12PLY STRL (GAUZE/BANDAGES/DRESSINGS) ×2 IMPLANT
GAUZE XEROFORM 1X8 LF (GAUZE/BANDAGES/DRESSINGS) ×2 IMPLANT
GLOVE SRG 8 PF TXTR STRL LF DI (GLOVE) ×1 IMPLANT
GLOVE SURG ENC MOIS LTX SZ7.5 (GLOVE) ×4 IMPLANT
GLOVE SURG ENC MOIS LTX SZ8 (GLOVE) ×4 IMPLANT
GLOVE SURG UNDER LTX SZ8 (GLOVE) ×2 IMPLANT
GLOVE SURG UNDER POLY LF SZ8 (GLOVE) ×2
GOWN STRL REUS W/ TWL LRG LVL3 (GOWN DISPOSABLE) ×1 IMPLANT
GOWN STRL REUS W/ TWL XL LVL3 (GOWN DISPOSABLE) ×1 IMPLANT
GOWN STRL REUS W/TWL LRG LVL3 (GOWN DISPOSABLE) ×2
GOWN STRL REUS W/TWL XL LVL3 (GOWN DISPOSABLE) ×2
GRASPER SUT 15 45D LOW PRO (SUTURE) IMPLANT
IMPL REGENETEN MEDIUM (Shoulder) ×1 IMPLANT
IMPLANT REGENETEN MEDIUM (Shoulder) ×2 IMPLANT
IV LACTATED RINGER IRRG 3000ML (IV SOLUTION) ×2
IV LR IRRIG 3000ML ARTHROMATIC (IV SOLUTION) ×1 IMPLANT
MANIFOLD NEPTUNE II (INSTRUMENTS) ×4 IMPLANT
MASK FACE SPIDER DISP (MASK) ×2 IMPLANT
MAT ABSORB  FLUID 56X50 GRAY (MISCELLANEOUS) ×1
MAT ABSORB FLUID 56X50 GRAY (MISCELLANEOUS) ×1 IMPLANT
PACK ARTHROSCOPY SHOULDER (MISCELLANEOUS) ×2 IMPLANT
PENCIL SMOKE EVACUATOR (MISCELLANEOUS) ×2 IMPLANT
SLING ARM LRG DEEP (SOFTGOODS) IMPLANT
SLING ULTRA II LG (MISCELLANEOUS) IMPLANT
STAPLER SKIN PROX 35W (STAPLE) ×2 IMPLANT
STRAP SAFETY 5IN WIDE (MISCELLANEOUS) ×2 IMPLANT
SUT ETHIBOND 0 MO6 C/R (SUTURE) ×2 IMPLANT
SUT VIC AB 2-0 CT1 27 (SUTURE) ×4
SUT VIC AB 2-0 CT1 TAPERPNT 27 (SUTURE) ×2 IMPLANT
TAPE MICROFOAM 4IN (TAPE) ×2 IMPLANT
TUBING ARTHRO INFLOW-ONLY STRL (TUBING) ×2 IMPLANT
WAND WEREWOLF FLOW 90D (MISCELLANEOUS) ×2 IMPLANT

## 2020-07-23 NOTE — Anesthesia Preprocedure Evaluation (Addendum)
Anesthesia Evaluation  Patient identified by MRN, date of birth, ID band Patient awake    Reviewed: Allergy & Precautions, NPO status , Patient's Chart, lab work & pertinent test results  Airway Mallampati: II  TM Distance: >3 FB     Dental   Pulmonary neg pulmonary ROS,    Pulmonary exam normal        Cardiovascular hypertension, Normal cardiovascular exam     Neuro/Psych negative neurological ROS  negative psych ROS   GI/Hepatic Neg liver ROS, GERD  ,  Endo/Other  diabetesHypothyroidism   Renal/GU negative Renal ROS  negative genitourinary   Musculoskeletal  (+) Arthritis , Osteoarthritis,    Abdominal Normal abdominal exam  (+)   Peds negative pediatric ROS (+)  Hematology negative hematology ROS (+)   Anesthesia Other Findings Past Medical History: No date: Arthritis     Comment:  LEFT HANDS No date: Diabetes type 2, controlled ([facility]) No date: GERD (gastroesophageal reflux disease)     Comment:  RARE No date: Hypercholesterolemia No date: Hypertension No date: Hypothyroidism No date: Malignant neoplasm of thyroid gland ([facility])  Reproductive/Obstetrics                             Anesthesia Physical Anesthesia Plan  ASA: II  Anesthesia Plan: General   Post-op Pain Management:  Regional for Post-op pain   Induction: Intravenous  PONV Risk Score and Plan:   Airway Management Planned: Oral ETT  Additional Equipment:   Intra-op Plan:   Post-operative Plan: Extubation in OR  Informed Consent: I have reviewed the patients History and Physical, chart, labs and discussed the procedure including the risks, benefits and alternatives for the proposed anesthesia with the patient or authorized representative who has indicated his/her understanding and acceptance.     Dental advisory given  Plan Discussed with: CRNA and Surgeon  Anesthesia Plan Comments: (Discussed  Interscalene  block for postop pain control.  We discussed risks which include cardiac arrest, pneumothorax, difficulty breathing, nerve damage, infection and block not working well .  Patient accepts risks and wishes to proceed with the block requested by the surgeon.)       Anesthesia Quick Evaluation

## 2020-07-23 NOTE — Discharge Instructions (Addendum)
Orthopedic discharge instructions: Keep dressing dry and intact.  May shower after dressing changed on post-op day #4 (Monday).  Cover staples/sutures with Band-Aids after drying off. Apply ice frequently to shoulder. Take ibuprofen 600-800 mg TID with meals for 7-10 days, then as necessary.(Take 800mg  every 8 hours alternate with tylenol) Take oxycodone as prescribed when needed.  May supplement with ES Tylenol if necessary.  Take 1000mg  every 8 hours  ( alternate with ibuprofen) Keep shoulder immobilizer on at all times except may remove for bathing purposes. Follow-up in 10-14 days or as scheduled.  AMBULATORY SURGERY  DISCHARGE INSTRUCTIONS   1) The drugs that you were given will stay in your system until tomorrow so for the next 24 hours you should not:  A) Drive an automobile B) Make any legal decisions C) Drink any alcoholic beverage   2) You may resume regular meals tomorrow.  Today it is better to start with liquids and gradually work up to solid foods.  You may eat anything you prefer, but it is better to start with liquids, then soup and crackers, and gradually work up to solid foods.   3) Please notify your doctor immediately if you have any unusual bleeding, trouble breathing, redness and pain at the surgery site, drainage, fever, or pain not relieved by medication.    4) Additional Instructions:    Please contact your physician with any problems or Same Day Surgery at [phone], Monday through Friday 6 am to 4 pm, or [place] [facility] number at [phone].      Interscalene Nerve Block with Exparel    LEAVE GREEN ARM BAND ON FOR 4 DAYS  1.  For your surgery you have received an Interscalene Nerve Block with Exparel. 2. Nerve Blocks affect many types of nerves, including nerves that control movement, pain and normal sensation.  You may experience feelings such as numbness, tingling, heaviness, weakness or the inability to move your arm or the  feeling or sensation that your arm has "fallen asleep". 3. A nerve block with Exparel can last up to 5 days.  Usually the weakness wears off first.  The tingling and heaviness usually wear off next.  Finally you may start to notice pain.  Keep in mind that this may occur in any order.  Once a nerve block starts to wear off it is usually completely gone within 60 minutes. 4. ISNB may cause mild shortness of breath, a hoarse voice, blurry vision, unequal pupils, or drooping of the face on the same side as the nerve block.  These symptoms will usually resolve with the numbness.  Very rarely the procedure itself can cause mild seizures. 5. If needed, your surgeon will give you a prescription for pain medication.  It will take about 60 minutes for the oral pain medication to become fully effective.  So, it is recommended that you start taking this medication before the nerve block first begins to wear off, or when you first begin to feel discomfort. 6. Take your pain medication only as prescribed.  Pain medication can cause sedation and decrease your breathing if you take more than you need for the level of pain that you have. 7. Nausea is a common side effect of many pain medications.  You may want to eat something before taking your pain medicine to prevent nausea. 8. After an Interscalene nerve block, you cannot feel pain, pressure or extremes in temperature in the effected arm.  Because your arm is numb it  is at an increased risk for injury.  To decrease the possibility of injury, please practice the following:  a. While you are awake change the position of your arm frequently to prevent too much pressure on any one area for prolonged periods of time. b.  If you have a cast or tight dressing, check the color or your fingers every couple of hours.  Call your surgeon with the appearance of any discoloration (white or blue). c. If you are given a sling to wear before you go home, please wear it  at all times  until the block has completely worn off.  Do not get up at night without your sling. d. Please contact [facility] or your surgeon if you do not begin to regain sensation after 7 days from the surgery.  Anesthesia may be contacted by calling the Same Day Surgery Department, Mon. through Fri., 6 am to 4 pm at [phone].   e. If you experience any other problems or concerns, please contact your surgeon's office. f. If you experience severe or prolonged shortness of breath go to the nearest emergency department.

## 2020-07-23 NOTE — Transfer of Care (Signed)
Immediate Anesthesia Transfer of Care Note  Patient: Andrea Buckley  Procedure(s) Performed: SHOULDER ARTHROSCOPY WITH DEBRIDEMENT, DECOMPRESSION, BICEPS TENODESIS, POSSIBLE SLAP REPAIR, AND MANIPULATION UNDER ANESTHESIA. (Left Shoulder)  Patient Location: PACU  Anesthesia Type:General  Level of Consciousness: drowsy and patient cooperative  Airway & Oxygen Therapy: Patient Spontanous Breathing and Patient connected to face mask oxygen  Post-op Assessment: Report given to RN and Post -op Vital signs reviewed and stable  Post vital signs: Reviewed and stable  Last Vitals:  Vitals Value Taken Time  BP 141/62 07/23/20 1545  Temp 36.1 C 07/23/20 1542  Pulse 76 07/23/20 1553  Resp 11 07/23/20 1553  SpO2 100 % 07/23/20 1553  Vitals shown include unvalidated device data.  Last Pain:  Vitals:   07/23/20 1542  TempSrc:   PainSc: 0-No pain         Complications: No complications documented.

## 2020-07-23 NOTE — Op Note (Signed)
07/23/2020  3:44 PM  Patient:   Andrea Buckley  Pre-Op Diagnosis:   Primary adhesive capsulitis with impingement/tendinopathy and possible SLAP tear, left shoulder.  Post-Op Diagnosis:   Primary adhesive capsulitis, impingement/tendinopathy with partial-thickness rotator cuff tear, and biceps tendinopathy, left shoulder.  Procedure:   Extensive arthroscopic debridement, arthroscopic subacromial decompression, manipulation under anesthesia, mini-open rotator cuff repair using a [employer] Regeneten patch, and mini-open biceps tenodesis, left shoulder.  Anesthesia:   General endotracheal with interscalene block using Exparel placed preoperatively by the anesthesiologist.  Surgeon:   Pascal Lux, MD  Assistant:   Cameron Proud, PA-C  Findings:   As above. There was an articular sided partial-thickness tear involving the anterior insertional fibers of the subscapularis tendon with approximately 20% footprint compromise. The remainder the rotator cuff was in satisfactory condition, as was the labrum. There was prominent "lip sticking" of the biceps tendon without partial or full-thickness tears. No significant degenerative changes of either the humerus or glenoid articular surfaces were noted.  Prior to manipulation, the left shoulder could be forward flexed to 140 and abducted to 130. At 90 of abduction, the shoulder could be externally rotated to 70 and internally rotated to 30. Following manipulation, the shoulder could be forward flexed to 170, abducted to 160 and, at 90 of abduction, externally rotated to 90 and internally rotated to 65.  Complications:   None  Fluids:   700 cc  Estimated blood loss:   20 cc  Tourniquet time:   None  Drains:   None  Closure:   Staples      Brief clinical note:   The patient is a 70 year old female with a nearly 1 year history of progressively worsening pain and limited range of motion of her left shoulder which developed without  any obvious injury. The patient's symptoms have progressed despite medications, activity modification, physical therapy, etc. The patient's history and examination are consistent with primary adhesive capsulitis. A preoperative MRI scan confirmed findings consistent with adhesive capsulitis, as well as impingement/tendinopathy and a possible SLAP tear. The patient presents at this time for definitive management of these shoulder symptoms.  Procedure:   The patient underwent placement of an interscalene block using Exparel by the anesthesiologist in the preoperative holding area before being brought into the operating room and lain in the supine position. The patient then underwent general endotracheal intubation and anesthesia before being repositioned in the beach chair position using the beach chair positioner. A timeout was performed to verify the appropriate surgical site before the left shoulder was gently manipulated in both abduction and external rotation, as well as adduction and internal rotation. Several palpable and audible pops were heard as the scar tissue released, permitting full range of motion of the shoulder.   The left shoulder and upper extremity were prepped with ChloraPrep solution before being draped sterilely. Preoperative antibiotics were administered. A repeat timeout was performed to confirm the proper surgical site before the expected portal sites and incision site were injected with 0.5% Sensorcaine with epinephrine. A posterior portal was created and the glenohumeral joint thoroughly inspected with the findings as described above. An anterior portal was created using an outside-in technique. The labrum and rotator cuff were further probed, again confirming the above-noted findings. Areas of synovitis and as well as the area of partial-thickness tearing involving the articular surface of the supraspinatus tendon were debrided back to stable margins using the full-radius resector. The  ArthroCare wand was inserted and used to  release the biceps tendon from its labral anchor.  It also was used to obtain hemostasis as well as to "anneal" the labrum superiorly and anteriorly. The instruments were removed from the joint after suctioning the excess fluid.  The camera was repositioned through the posterior portal into the subacromial space. A separate lateral portal was created using an outside-in technique. The 3.5 mm full-radius resector was introduced and used to perform a subtotal bursectomy. The ArthroCare wand was then inserted and used to remove the periosteal tissue off the undersurface of the anterior third of the acromion as well as to recess the coracoacromial ligament from its attachment along the anterior and lateral margins of the acromion. The 4.0 mm acromionizing bur was introduced and used to complete the decompression by removing the undersurface of the anterior third of the acromion. The full radius resector was reintroduced to remove any residual bony debris before the ArthroCare wand was reintroduced to obtain hemostasis. The instruments were then removed from the subacromial space after suctioning the excess fluid.  An approximately 4-5 cm incision was made over the anterolateral aspect of the shoulder beginning at the anterolateral corner of the acromion and extending distally in line with the bicipital groove. This incision was carried down through the subcutaneous tissues to expose the deltoid fascia. The raphae between the anterior and middle thirds was identified and this plane developed to provide access into the subacromial space. Additional bursal tissues were debrided sharply using Metzenbaum scissors. The rotator cuff carefully inspected. The bursal surface appeared to be intact, but the area of articular sided tearing of the supraspinatus could be identified by palpation. Therefore, it was elected to proceed with reinforcing/repairing this area using a [place] patch. A medium patch was selected and applied over this area using the appropriate bone and soft tissue staples..  The bicipital groove was identified by palpation and opened for 1-1.5 cm. The biceps tendon stump was retrieved through this defect. The floor of the bicipital groove was roughened with a curet before a single Biomet 2.9 mm JuggerKnot anchor was inserted. Both sets of sutures were passed through the biceps tendon and tied securely to effect the tenodesis. The bicipital sheath was reapproximated using two #0 Ethibond interrupted sutures, incorporating the biceps tendon to further reinforce the tenodesis.  The wound was copiously irrigated with sterile saline solution before the deltoid raphae was reapproximated using 2-0 Vicryl interrupted sutures. The subcutaneous tissues were closed in two layers using 2-0 Vicryl interrupted sutures before the skin was closed using staples. The portal sites also were closed using staples. A sterile bulky dressing was applied to the shoulder before the arm was placed into a shoulder immobilizer. The patient was then awakened, extubated, and returned to the recovery room in satisfactory condition after tolerating the procedure well.

## 2020-07-23 NOTE — Anesthesia Procedure Notes (Signed)
Procedure Name: Intubation Date/Time: 07/23/2020 2:09 PM Performed by: Aline Brochure, CRNA Pre-anesthesia Checklist: Patient identified, Emergency Drugs available, Suction available and Patient being monitored Patient Re-evaluated:Patient Re-evaluated prior to induction Oxygen Delivery Method: Circle system utilized Preoxygenation: Pre-oxygenation with 100% oxygen Induction Type: IV induction Ventilation: Mask ventilation without difficulty Laryngoscope Size: McGraph and 3 Grade View: Grade I Tube type: Oral Tube size: 7.0 mm Number of attempts: 1 Airway Equipment and Method: Stylet and Video-laryngoscopy Placement Confirmation: ETT inserted through vocal cords under direct vision,  positive ETCO2 and breath sounds checked- equal and bilateral Secured at: 21 cm Tube secured with: Tape Dental Injury: Teeth and Oropharynx as per pre-operative assessment

## 2020-07-23 NOTE — H&P (Signed)
History of Present Illness:  Andrea Buckley is a 70 y.o. female who presents for follow-up of her left shoulder pain secondary to impingement/tendinopathy with MRI documented SLAP tear and adhesive capsulitis. The patient continues to note discomfort in her shoulder and in fact feels that her symptoms have worsened since her last visit 2 months ago. This morning, she rates it at 2/10, but notes that she has not been doing anything with her shoulder so far today. She notes that her symptoms can be more severe at times and 4, for which she will take ibuprofen and/or apply heat with temporary partial relief of her symptoms. She localizes the pain to the anterolateral aspect of the shoulder which radiates down to her elbow. Her symptoms are aggravated with repetitive activities as well as activities at or above shoulder level. She also has discomfort at nighttime, interfering with her ability to sleep well. She has been trying to do some exercises on her own at home. She denies any reinjury to the shoulder, and denies any numbness or paresthesias down her arm to her hand.  Current Outpatient Medications: . ACCU-CHEK AVIVA PLUS METER Misc Use to test blood sugar 1 each 0  . ALPHA LIPOIC ACID ORAL Take 200 mg by mouth 2 (two) times daily  . ascorbic acid, vitamin C, (VITAMIN C) 100 MG tablet Take 100 mg by mouth once daily  . biotin 100 mg/gram Powd Take by mouth  . cholecalciferol (CHOLECALCIFEROL) 1,000 unit tablet Take 1,000 Units by mouth once daily  . COQ10, UBIQUINOL, ORAL Take by mouth  . cyanocobalamin (VITAMIN B12) 1000 MCG tablet Take 1,000 mcg by mouth once daily.  Marland Kitchen levothyroxine (SYNTHROID) 100 MCG tablet TAKE 1 TABLET BY MOUTH ONCE DAILY MONDAY THROUGH SATURDAY AND 1&1/2 TABS EACH WEEK ON SUNDAY 96 tablet 4  . lisinopriL (ZESTRIL) 10 MG tablet TAKE 1 TABLET BY MOUTH EVERY DAY 90 tablet 0  . metFORMIN (GLUCOPHAGE) 1000 MG tablet TAKE 1 TABLET BY MOUTH TWICE A DAY 180 tablet 4  .  multivitamin tablet Take by mouth  . rosuvastatin (CRESTOR) 10 MG tablet TAKE 1 TABLET BY MOUTH EVERY DAY 90 tablet 4  . sodium fluoride-pot nitrate 1.1-5 % as directed  . ZINC ORAL Take by mouth   Allergies: No Known Allergies  Past Medical History:  . Adhesive capsulitis of left shoulder 04/06/2020  . Cancer (CMS-HCC) - thyroid  . Dyslipidemia  . Essential hypertension, benign  . History of thyroid cancer 1993  . Hypothyroidism  . Other and unspecified hyperlipidemia  . Plantar fascial fibromatosis  . Post-menopause  . Pure hypercholesterolemia  . Type 2 diabetes mellitus (CMS-HCC)  . Unspecified essential hypertension   Past Surgical History:  . CESAREAN SECTION  . COLONOSCOPY 10/25/2011, 06/19/2006 (Dr. Kurtis Bushman @ [place], redundant colon - rpt 5 yrs per [facility])  . COLONOSCOPY 12/04/2017 (Tubular adenoma of the colon/Repeat 33yrs/[facility])  . NSVD 1984; 1982  . Sinus/nasal surgery 1976  . THYROIDECTOMY TOTAL 1993   Family History:  . Diabetes Father  . Heart disease Father  . Diabetes Sister  . Diabetes Brother  . Diabetes Paternal Aunt  . Diabetes Paternal Uncle  . Diabetes Maternal Grandmother  . Diabetes Paternal Grandfather  . Heart disease Mother   Social History:   Socioeconomic History:  Marland Kitchen Marital status: Married  Spouse name: Not on file  . Number of children: Not on file  . Years of education: Not on file  . Highest education level: Not  on file  Occupational History  . Not on file  Tobacco Use  . Smoking status: Never Smoker  . Smokeless tobacco: Never Used  Vaping Use  . Vaping Use: Never used  Substance and Sexual Activity  . Alcohol use: Not Currently  Alcohol/week: 0.0 standard drinks  . Drug use: No  . Sexual activity: Defer  Other Topics Concern  . Not on file  Social History Narrative  . Not on file   Social Determinants of Health:   Financial Resource Strain: Not on file  Food Insecurity: Not on file  Transportation Needs: Not  on file   Review of Systems:  A comprehensive 14 point ROS was performed, reviewed, and the pertinent orthopaedic findings are documented in the HPI.  Physical Exam: Vitals:  06/08/20 0823  BP: 130/86  Weight: 67.3 kg (148 lb 6.4 oz)  Height: 167.6 cm (5\' 6" )  PainSc: 2  PainLoc: Shoulder   General/Constitutional: The patient appears to be well-nourished, well-developed, and in no acute distress. Neuro/Psych: Normal mood and affect, oriented to person, place and time.  Eyes: Non-icteric. Pupils are equal, round, and reactive to light, and exhibit synchronous movement. ENT: Unremarkable. Lymphatic: No palpable adenopathy. Respiratory: Lungs clear to auscultation, Normal chest excursion, No wheezes and Non-labored breathing Cardiovascular: Regular rate and rhythm. No murmurs. and No edema, swelling or tenderness, except as noted in detailed exam. Integumentary: No impressive skin lesions present, except as noted in detailed exam. Musculoskeletal: Unremarkable, except as noted in detailed exam.  Left shoulder exam: SKIN: Normal SWELLING: None WARMTH: None LYMPH NODES: No adenopathy palpable CREPITUS: None TENDERNESS: Minimally tender over lateral acromion ROM (active):  Forward flexion: 135 degrees Abduction: 125 degrees Internal rotation: Left buttock ROM (passive):  Forward flexion: 140 degrees Abduction: 130  ER/IR at 90 abd: 70 degrees/35 degrees  She experiences mild pain at the extremes of all motions.  STRENGTH: Forward flexion: 4-4+/5 Abduction: 4/5 External rotation: 4+/5 Internal rotation: 4+/5 Pain with RC testing: Mild-moderate pain with resisted abduction more so than with resisted forward flexion  STABILITY: Normal  SPECIAL TESTS: Luan Pulling' test: Mildly positive Speed's test: Negative Capsulitis - pain w/ passive ER: no Crossed arm test: Mildly positive Crank: Not evaluated Anterior apprehension: Negative  She remains neurovascularly intact to  the left upper extremity.  Assessment: . Superior labrum anterior-to-posterior (SLAP) tear of left shoulder.  . Rotator cuff tendinitis, left.  . Adhesive capsulitis of left shoulder.   Plan: The treatment options were discussed with the patient. In addition, patient educational materials were provided regarding the diagnosis and treatment options. The patient is quite frustrated by her symptoms and functional limitations, and now is ready to consider more aggressive treatment options. Therefore, I have recommended a surgical procedure, specifically a left shoulder arthroscopy with debridement, subacromial decompression, biceps tenodesis, possible SLAP repair, and manipulation under anesthesia. The procedure was discussed with the patient, as were the potential risks (including bleeding, infection, nerve and/or blood vessel injury, persistent or recurrent pain, failure of the repair, progression of arthritis, need for further surgery, blood clots, strokes, heart attacks and/or arhythmias, pneumonia, etc.) and benefits. The patient states her understanding and wishes to proceed. In addition, the patient has been fitted with a Slingshot shoulder immobilizer to wear postoperatively. All of the patient's questions and concerns were answered. She can call any time with further concerns. She will follow up post-surgery, routine.    H&P reviewed and patient re-examined. No changes.

## 2020-07-23 NOTE — Anesthesia Procedure Notes (Signed)
Anesthesia Regional Block: Interscalene brachial plexus block   Pre-Anesthetic Checklist: ,, timeout performed, Correct Patient, Correct Site, Correct Laterality, Correct Procedure, Correct Position, site marked, Risks and benefits discussed,  Surgical consent,  Pre-op evaluation,  At surgeon's request and post-op pain management  Laterality: Left  Prep: chloraprep, alcohol swabs       Needles:  Injection technique: Single-shot  Needle Type: Stimiplex     Needle Length: 5cm  Needle Gauge: 22     Additional Needles:   Procedures:, nerve stimulator,,, ultrasound used (permanent image in chart),,,,   Nerve Stimulator or Paresthesia:  Response: biceps flexion, 0.5 mA,   Additional Responses:   Narrative:  Start time: 07/23/2020 1:39 PM End time: 07/23/2020 1:43 PM Injection made incrementally with aspirations every 5 mL.  Performed by: Personally   Additional Notes: Functioning IV was confirmed and monitors were applied.  A 61mm 22ga Stimuplex needle was used. Sterile prep and drape,hand hygiene and sterile gloves were used.  Negative aspiration and negative test dose prior to incremental administration of local anesthetic. The patient tolerated the procedure well. Easy incremental injection with no pain on injection.

## 2020-07-24 ENCOUNTER — Encounter: Payer: Self-pay | Admitting: Surgery

## 2020-07-24 NOTE — Anesthesia Postprocedure Evaluation (Signed)
Anesthesia Post Note  Patient: Andrea Buckley  Procedure(s) Performed: SHOULDER ARTHROSCOPY WITH DEBRIDEMENT, DECOMPRESSION, BICEPS TENODESIS, POSSIBLE SLAP REPAIR, AND MANIPULATION UNDER ANESTHESIA. (Left Shoulder)  Anesthesia Type: General Level of consciousness: awake and alert and oriented Pain management: pain level controlled Vital Signs Assessment: post-procedure vital signs reviewed and stable Respiratory status: spontaneous breathing Cardiovascular status: blood pressure returned to baseline Anesthetic complications: no   No complications documented.   Last Vitals:  Vitals:   07/23/20 1620 07/23/20 1646  BP: 138/71 (!) 130/58  Pulse: 76 73  Resp: 16 16  Temp: 36.5 C   SpO2: 100% 100%    Last Pain:  Vitals:   07/24/20 0832  TempSrc:   PainSc: 0-No pain                 Emmagene Ortner

## 2020-10-21 ENCOUNTER — Other Ambulatory Visit: Payer: Self-pay | Admitting: Surgery

## 2020-10-27 ENCOUNTER — Other Ambulatory Visit: Payer: Self-pay

## 2020-10-27 ENCOUNTER — Encounter: Payer: Self-pay | Admitting: Urgent Care

## 2020-10-27 ENCOUNTER — Other Ambulatory Visit
Admission: RE | Admit: 2020-10-27 | Discharge: 2020-10-27 | Disposition: A | Discharge status: Home / Self Care | Payer: Medicare HMO | Source: Ambulatory Visit | Attending: Surgery | Admitting: Surgery

## 2020-10-27 DIAGNOSIS — Z01812 Encounter for preprocedural laboratory examination: Secondary | ICD-10-CM | POA: Diagnosis present

## 2020-10-27 HISTORY — DX: Pneumonia, unspecified organism: J18.9

## 2020-10-27 HISTORY — DX: Depression, unspecified: F32.A

## 2020-10-27 LAB — CBC
HCT: 44.2 % (ref 36.0–46.0)
Hemoglobin: 14.3 g/dL (ref 12.0–15.0)
MCH: 29.5 pg (ref 26.0–34.0)
MCHC: 32.4 g/dL (ref 30.0–36.0)
MCV: 91.1 fL (ref 80.0–100.0)
Platelets: 306 10*3/uL (ref 150–400)
RBC: 4.85 MIL/uL (ref 3.87–5.11)
RDW: 12.5 % (ref 11.5–15.5)
WBC: 8.9 10*3/uL (ref 4.0–10.5)
nRBC: 0 % (ref 0.0–0.2)

## 2020-10-27 LAB — BASIC METABOLIC PANEL
Anion gap: 9 (ref 5–15)
BUN: 17 mg/dL (ref 8–23)
CO2: 27 mmol/L (ref 22–32)
Calcium: 9 mg/dL (ref 8.9–10.3)
Chloride: 101 mmol/L (ref 98–111)
Creatinine, Ser: 1.07 mg/dL — ABNORMAL HIGH (ref 0.44–1.00)
GFR, Estimated: 56 mL/min — ABNORMAL LOW (ref >60)
Glucose, Bld: 110 mg/dL — ABNORMAL HIGH (ref 70–99)
Potassium: 4.3 mmol/L (ref 3.5–5.1)
Sodium: 137 mmol/L (ref 135–145)

## 2020-10-27 NOTE — Patient Instructions (Addendum)
Your procedure is scheduled on: 10/28/20 Wednesday Report to the Registration Desk on the 1st floor of the [facility]. To find out your arrival time, please call [phone] between 1PM - 3PM on: 10/27/20 Tuesday  REMEMBER: Instructions that are not followed completely may result in serious medical risk, up to and including death; or upon the discretion of your surgeon and anesthesiologist your surgery may need to be rescheduled.  Do not eat food after midnight the night before surgery.  No gum chewing, lozengers or hard candies.  You may however, drink CLEAR liquids up to 2 hours before you are scheduled to arrive for your surgery. Do not drink anything within 2 hours of your scheduled arrival time. Type 1 and Type 2 diabetics should only drink water.  TAKE THESE MEDICATIONS THE MORNING OF SURGERY WITH A SIP OF WATER:  - levothyroxine (SYNTHROID, LEVOTHROID) 100 MCG tablet  Stop Metformin 2 days prior to surgery.  One week prior to surgery: Stop Anti-inflammatories (NSAIDS) such as Advil, Aleve, Ibuprofen, Motrin, Naproxen, Naprosyn and Aspirin based products such as Excedrin, Goodys Powder, BC Powder.  Stop ANY OVER THE COUNTER supplements until after surgery.  You may take Tylenol as directed if needed for pain up until the day of surgery.  No Alcohol for 24 hours before or after surgery.  No Smoking including e-cigarettes for 24 hours prior to surgery.  No chewable tobacco products for at least 6 hours prior to surgery.  No nicotine patches on the day of surgery.  Do not use any "recreational" drugs for at least a week prior to your surgery.  Please be advised that the combination of cocaine and anesthesia may have negative outcomes, up to and including death. If you test positive for cocaine, your surgery will be cancelled.  On the morning of surgery brush your teeth with toothpaste and water, you may rinse your mouth with mouthwash if you wish. Do not swallow any  toothpaste or mouthwash.  Do not wear jewelry, make-up, hairpins, clips or nail polish.  Do not wear lotions, powders, or perfumes.   Do not shave body from the neck down 48 hours prior to surgery just in case you cut yourself which could leave a site for infection.  Also, freshly shaved skin may become irritated if using the CHG soap.  Contact lenses, hearing aids and dentures may not be worn into surgery.  Do not bring valuables to the hospital. [facility] is not responsible for any missing/lost belongings or valuables.   Use CHG Soap or wipes as directed on instruction sheet.  Notify your doctor if there is any change in your medical condition (cold, fever, infection).  Wear comfortable clothing (specific to your surgery type) to the hospital.  After surgery, you can help prevent lung complications by doing breathing exercises.  Take deep breaths and cough every 1-2 hours. Your doctor may order a device called an Incentive Spirometer to help you take deep breaths. When coughing or sneezing, hold a pillow firmly against your incision with both hands. This is called "splinting." Doing this helps protect your incision. It also decreases belly discomfort.  If you are being admitted to the hospital overnight, leave your suitcase in the car. After surgery it may be brought to your room.  If you are being discharged the day of surgery, you will not be allowed to drive home. You will need a responsible adult (18 years or older) to drive you home and stay with you that night.  If you are taking public transportation, you will need to have a responsible adult (18 years or older) with you. Please confirm with your physician that it is acceptable to use public transportation.   Please call the [facility] at [phone] if you have any questions about these instructions.  Surgery Visitation Policy:  Patients undergoing a surgery or procedure may have one family member  or support person with them as long as that person is not COVID-19 positive or experiencing its symptoms.  That person may remain in the waiting area during the procedure.  Inpatient Visitation:    Visiting hours are 7 a.m. to 8 p.m. Inpatients will be allowed two visitors daily. The visitors may change each day during the patient's stay. No visitors under the age of 82. Any visitor under the age of 54 must be accompanied by an adult. The visitor must pass COVID-19 screenings, use hand sanitizer when entering and exiting the patient's room and wear a mask at all times, including in the patient's room. Patients must also wear a mask when staff or their visitor are in the room. Masking is required regardless of vaccination status.

## 2020-10-28 ENCOUNTER — Ambulatory Visit
Admission: RE | Admit: 2020-10-28 | Discharge: 2020-10-28 | Disposition: A | Discharge status: Home / Self Care | Payer: Medicare HMO | Attending: Surgery | Admitting: Surgery

## 2020-10-28 ENCOUNTER — Ambulatory Visit: Payer: Medicare HMO

## 2020-10-28 ENCOUNTER — Other Ambulatory Visit: Payer: Self-pay

## 2020-10-28 ENCOUNTER — Encounter
Admission: RE | Disposition: A | Discharge status: Home / Self Care | Payer: Self-pay | Source: Home / Self Care | Attending: Surgery

## 2020-10-28 ENCOUNTER — Encounter: Payer: Self-pay | Admitting: Surgery

## 2020-10-28 DIAGNOSIS — Z85850 Personal history of malignant neoplasm of thyroid: Secondary | ICD-10-CM | POA: Insufficient documentation

## 2020-10-28 DIAGNOSIS — Z79890 Hormone replacement therapy (postmenopausal): Secondary | ICD-10-CM | POA: Diagnosis not present

## 2020-10-28 DIAGNOSIS — Z7984 Long term (current) use of oral hypoglycemic drugs: Secondary | ICD-10-CM | POA: Insufficient documentation

## 2020-10-28 DIAGNOSIS — Z79899 Other long term (current) drug therapy: Secondary | ICD-10-CM | POA: Insufficient documentation

## 2020-10-28 DIAGNOSIS — M7502 Adhesive capsulitis of left shoulder: Secondary | ICD-10-CM | POA: Diagnosis not present

## 2020-10-28 HISTORY — PX: CLOSED MANIPULATION SHOULDER WITH STERIOD INJECTION: SHX5611

## 2020-10-28 LAB — GLUCOSE, CAPILLARY
Glucose-Capillary: 126 mg/dL — ABNORMAL HIGH (ref 70–99)
Glucose-Capillary: 127 mg/dL — ABNORMAL HIGH (ref 70–99)

## 2020-10-28 SURGERY — CLOSED MANIPULATION SHOULDER WITH STEROID INJECTION
Anesthesia: General | Site: Shoulder | Laterality: Left

## 2020-10-28 MED ORDER — SODIUM CHLORIDE 0.9 % IV SOLN: INTRAVENOUS | Status: DC

## 2020-10-28 MED ORDER — FAMOTIDINE 20 MG PO TABS
ORAL_TABLET | ORAL | Status: AC
  Administered 2020-10-28: 20 mg via ORAL
  Filled 2020-10-28: qty 1

## 2020-10-28 MED ORDER — SUGAMMADEX SODIUM 500 MG/5ML IV SOLN
INTRAVENOUS | Status: AC
  Filled 2020-10-28: qty 5

## 2020-10-28 MED ORDER — DEXMEDETOMIDINE (PRECEDEX) IN NS 20 MCG/5ML (4 MCG/ML) IV SYRINGE
PREFILLED_SYRINGE | INTRAVENOUS | Status: DC | PRN
  Administered 2020-10-28 (×2): 8 ug via INTRAVENOUS

## 2020-10-28 MED ORDER — TRAMADOL HCL 50 MG PO TABS: 50.0000 mg | ORAL_TABLET | Freq: Four times a day (QID) | ORAL | Status: DC | PRN

## 2020-10-28 MED ORDER — TRAMADOL HCL 50 MG PO TABS
ORAL_TABLET | ORAL | Status: AC
  Administered 2020-10-28: 50 mg via ORAL
  Filled 2020-10-28: qty 1

## 2020-10-28 MED ORDER — FENTANYL CITRATE (PF) 100 MCG/2ML IJ SOLN
25.0000 ug | INTRAMUSCULAR | Status: AC | PRN
  Administered 2020-10-28 (×4): 25 ug via INTRAVENOUS

## 2020-10-28 MED ORDER — ACETAMINOPHEN 500 MG PO TABS
ORAL_TABLET | ORAL | Status: AC
  Filled 2020-10-28: qty 2

## 2020-10-28 MED ORDER — ONDANSETRON HCL 4 MG PO TABS: 4.0000 mg | ORAL_TABLET | Freq: Four times a day (QID) | ORAL | Status: DC | PRN

## 2020-10-28 MED ORDER — FENTANYL CITRATE (PF) 100 MCG/2ML IJ SOLN
INTRAMUSCULAR | Status: DC | PRN
  Administered 2020-10-28 (×3): 25 ug via INTRAVENOUS

## 2020-10-28 MED ORDER — CEFAZOLIN SODIUM-DEXTROSE 2-4 GM/100ML-% IV SOLN: 2.0000 g | INTRAVENOUS | Status: DC

## 2020-10-28 MED ORDER — CHLORHEXIDINE GLUCONATE 0.12 % MT SOLN
OROMUCOSAL | Status: AC
  Administered 2020-10-28: 15 mL via OROMUCOSAL
  Filled 2020-10-28: qty 15

## 2020-10-28 MED ORDER — FENTANYL CITRATE (PF) 100 MCG/2ML IJ SOLN
INTRAMUSCULAR | Status: AC
  Administered 2020-10-28: 25 ug via INTRAVENOUS
  Filled 2020-10-28: qty 2

## 2020-10-28 MED ORDER — POTASSIUM CHLORIDE IN NACL 20-0.9 MEQ/L-% IV SOLN: INTRAVENOUS | Status: DC

## 2020-10-28 MED ORDER — ACETAMINOPHEN 500 MG PO TABS
1000.0000 mg | ORAL_TABLET | Freq: Once | ORAL | Status: AC
  Administered 2020-10-28: 1000 mg via ORAL

## 2020-10-28 MED ORDER — METOCLOPRAMIDE HCL 10 MG PO TABS: 5.0000 mg | ORAL_TABLET | Freq: Three times a day (TID) | ORAL | Status: DC | PRN

## 2020-10-28 MED ORDER — TRAMADOL HCL 50 MG PO TABS: 50.0000 mg | ORAL_TABLET | Freq: Four times a day (QID) | ORAL | 0 refills | Status: AC | PRN

## 2020-10-28 MED ORDER — PROPOFOL 10 MG/ML IV BOLUS
INTRAVENOUS | Status: AC
  Filled 2020-10-28: qty 20

## 2020-10-28 MED ORDER — METOCLOPRAMIDE HCL 5 MG/ML IJ SOLN: 5.0000 mg | Freq: Three times a day (TID) | INTRAMUSCULAR | Status: DC | PRN

## 2020-10-28 MED ORDER — CHLORHEXIDINE GLUCONATE 0.12 % MT SOLN
15.0000 mL | Freq: Once | OROMUCOSAL | Status: AC
Start: 2020-10-28 — End: 2020-10-28

## 2020-10-28 MED ORDER — BIOTIN 5000 MCG PO CAPS: 5000.0000 ug | ORAL_CAPSULE | Freq: Every day | ORAL | 0 refills | Status: AC

## 2020-10-28 MED ORDER — FAMOTIDINE 20 MG PO TABS: 20.0000 mg | ORAL_TABLET | Freq: Once | ORAL | Status: AC

## 2020-10-28 MED ORDER — ONDANSETRON HCL 4 MG/2ML IJ SOLN: 4.0000 mg | Freq: Four times a day (QID) | INTRAMUSCULAR | Status: DC | PRN

## 2020-10-28 MED ORDER — TRIAMCINOLONE ACETONIDE 40 MG/ML IJ SUSP
INTRAMUSCULAR | Status: DC | PRN
  Administered 2020-10-28: 10 mL

## 2020-10-28 MED ORDER — FENTANYL CITRATE (PF) 100 MCG/2ML IJ SOLN
INTRAMUSCULAR | Status: AC
  Filled 2020-10-28: qty 2

## 2020-10-28 MED ORDER — DEXMEDETOMIDINE (PRECEDEX) IN NS 20 MCG/5ML (4 MCG/ML) IV SYRINGE
PREFILLED_SYRINGE | INTRAVENOUS | Status: AC
  Filled 2020-10-28: qty 5

## 2020-10-28 MED ORDER — PROPOFOL 10 MG/ML IV BOLUS
INTRAVENOUS | Status: DC | PRN
  Administered 2020-10-28: 40 mg via INTRAVENOUS

## 2020-10-28 MED ORDER — ORAL CARE MOUTH RINSE: 15.0000 mL | Freq: Once | OROMUCOSAL | Status: AC

## 2020-10-28 SURGICAL SUPPLY — 8 items
BNDG ADH 1X3 SHEER STRL LF (GAUZE/BANDAGES/DRESSINGS) ×2 IMPLANT
KIT TURNOVER KIT A (KITS) IMPLANT
MANIFOLD NEPTUNE II (INSTRUMENTS) IMPLANT
NEEDLE HYPO 21X1.5 SAFETY (NEEDLE) ×2 IMPLANT
PAD ALCOHOL SWAB (MISCELLANEOUS) ×4 IMPLANT
SLING ARM LRG DEEP (SOFTGOODS) ×2 IMPLANT
SYR 10ML LL (SYRINGE) ×2 IMPLANT
WATER STERILE IRR 500ML POUR (IV SOLUTION) IMPLANT

## 2020-10-28 NOTE — Transfer of Care (Signed)
Immediate Anesthesia Transfer of Care Note  Patient: Andrea Buckley  Procedure(s) Performed: LEFT SHOULDER MANIPULATION UNDER ANESTHESIA WITH STEROID INJECTION. (Left: Shoulder)  Patient Location: PACU  Anesthesia Type:MAC  Level of Consciousness: awake  Airway & Oxygen Therapy: Patient Spontanous Breathing and Patient connected to nasal cannula oxygen  Post-op Assessment: Report given to RN and Post -op Vital signs reviewed and stable  Post vital signs: Reviewed and stable  Last Vitals:  Vitals Value Taken Time  BP 122/62 10/28/20 1355  Temp    Pulse 78 10/28/20 1359  Resp 21 10/28/20 1359  SpO2 99 % 10/28/20 1359  Vitals shown include unvalidated device data.  Last Pain:  Vitals:   10/28/20 1129  TempSrc: Temporal  PainSc: 0-No pain      Patients Stated Pain Goal: 0 (52/84/13 2440)  Complications: No notable events documented.

## 2020-10-28 NOTE — Anesthesia Preprocedure Evaluation (Signed)
Anesthesia Evaluation  Patient identified by MRN, date of birth, ID band Patient awake    Reviewed: Allergy & Precautions, NPO status , Patient's Chart, lab work & pertinent test results  History of Anesthesia Complications Negative for: history of anesthetic complications  Airway Mallampati: II  TM Distance: >3 FB     Dental  (+) Dental Advidsory Given   Pulmonary neg pulmonary ROS,    Pulmonary exam normal        Cardiovascular Exercise Tolerance: Good hypertension, (-) angina(-) Past MI and (-) Cardiac Stents Normal cardiovascular exam(-) dysrhythmias (-) Valvular Problems/Murmurs     Neuro/Psych PSYCHIATRIC DISORDERS Depression negative neurological ROS     GI/Hepatic Neg liver ROS, GERD  ,  Endo/Other  diabetesHypothyroidism   Renal/GU negative Renal ROS  negative genitourinary   Musculoskeletal  (+) Arthritis , Osteoarthritis,    Abdominal Normal abdominal exam  (+)   Peds negative pediatric ROS (+)  Hematology negative hematology ROS (+)   Anesthesia Other Findings Past Medical History: No date: Arthritis     Comment:  LEFT HANDS No date: Diabetes type 2, controlled (HCC) No date: GERD (gastroesophageal reflux disease)     Comment:  RARE No date: Hypercholesterolemia No date: Hypertension No date: Hypothyroidism No date: Malignant neoplasm of thyroid gland (HCC)  Reproductive/Obstetrics                             Anesthesia Physical  Anesthesia Plan  ASA: 2  Anesthesia Plan: General   Post-op Pain Management:  Regional for Post-op pain   Induction: Intravenous  PONV Risk Score and Plan: 3 and TIVA  Airway Management Planned: Natural Airway and Simple Face Mask  Additional Equipment:   Intra-op Plan:   Post-operative Plan:   Informed Consent: I have reviewed the patients History and Physical, chart, labs and discussed the procedure including the risks,  benefits and alternatives for the proposed anesthesia with the patient or authorized representative who has indicated his/her understanding and acceptance.     Dental advisory given  Plan Discussed with: CRNA and Surgeon  Anesthesia Plan Comments:         Anesthesia Quick Evaluation

## 2020-10-28 NOTE — Discharge Instructions (Addendum)
Orthopedic discharge instructions: May shower tonight. May use sling as needed for comfort tonight, then try to wean away from it. Apply ice to affected area frequently. Take ibuprofen 600 mg TID with meals for 7-10 days, then as necessary. Take ES Tylenol or pain medication as prescribed when needed.  Start PT tomorrow as scheduled. Return for follow-up in 10-14 days or as scheduled.AMBULATORY SURGERY  DISCHARGE INSTRUCTIONS   The drugs that you were given will stay in your system until tomorrow so for the next 24 hours you should not:  Drive an automobile Make any legal decisions Drink any alcoholic beverage   You may resume regular meals tomorrow.  Today it is better to start with liquids and gradually work up to solid foods.  You may eat anything you prefer, but it is better to start with liquids, then soup and crackers, and gradually work up to solid foods.   Please notify your doctor immediately if you have any unusual bleeding, trouble breathing, redness and pain at the surgery site, drainage, fever, or pain not relieved by medication.    Additional Instructions:  Please contact your physician with any problems or Same Day Surgery at [phone], Monday through Friday 6 am to 4 pm, or [place] at [facility] number at [phone].

## 2020-10-28 NOTE — H&P (Signed)
History of Present Illness:  Andrea Buckley is a 70 y.o. female who presents for follow-up now 12 weeks status post an extensive arthroscopic debridement, arthroscopic subacromial decompression, manipulation under anesthesia, mini-open rotator cuff repair using a [employer] Regeneten patch, and mini-open biceps tenodesis of the left shoulder. Overall, the patient feels that she is doing well. She denies any pain on today's visit, but still notes some soreness at times, especially with physical therapy, for which she will take ibuprofen as necessary with temporary partial relief. She continues to attend physical therapy, as well as perform exercises on her own at home, and feels that she is making some progress with her range of motion, strength, and overall function. However, she still notes some stiffness in the shoulder. She denies any reinjury to the shoulder, and denies any fevers or chills.   Current Outpatient Medications:  ACCU-CHEK AVIVA PLUS METER Misc Use to test blood sugar 1 each 0   ALPHA LIPOIC ACID ORAL Take 200 mg by mouth 2 (two) times daily   ascorbic acid, vitamin C, (VITAMIN C) 100 MG tablet Take 100 mg by mouth once daily   biotin 100 mg/gram Powd Take by mouth   cholecalciferol (VITAMIN D3) 1000 unit tablet Take 1,000 Units by mouth once daily   COQ10, UBIQUINOL, ORAL Take by mouth   cyanocobalamin (VITAMIN B12) 1000 MCG tablet Take 1,000 mcg by mouth once daily.   empagliflozin (JARDIANCE) 25 mg tablet Take 1 tablet (25 mg total) by mouth once daily 30 tablet 11   levothyroxine (SYNTHROID) 100 MCG tablet TAKE 1 TABLET BY MOUTH ONCE DAILY MONDAY THROUGH SATURDAY AND 1&1/2 TABS EACH WEEK ON SUNDAY 96 tablet 4   lisinopriL (ZESTRIL) 10 MG tablet TAKE 1 TABLET BY MOUTH EVERY DAY 90 tablet 3   metFORMIN (GLUCOPHAGE) 1000 MG tablet TAKE 1 TABLET BY MOUTH TWICE A DAY 180 tablet 4   multivitamin tablet Take by mouth   rosuvastatin (CRESTOR) 10 MG tablet TAKE 1 TABLET BY MOUTH  EVERY DAY 90 tablet 4   sodium fluoride-pot nitrate 1.1-5 % as directed   ZINC ORAL Take by mouth   Allergies: No Known Allergies  Past Medical History:   Adhesive capsulitis of left shoulder 04/06/2020   Cancer (CMS-HCC) - thyroid   Dyslipidemia   Essential hypertension, benign   History of thyroid cancer 1993   Hypothyroidism   Other and unspecified hyperlipidemia   Plantar fascial fibromatosis   Post-menopause   Pure hypercholesterolemia   Type 2 diabetes mellitus (CMS-HCC)   Unspecified essential hypertension   Past Surgical History:   CESAREAN SECTION   COLONOSCOPY 10/25/2011, 06/19/2006 (Dr. Kurtis Bushman @ [place], redundant colon - rpt 5 yrs per [facility])   COLONOSCOPY 12/04/2017 (Tubular adenoma of the colon/Repeat 72yr/[facility])   Extensive arthroscopic debridement, arthroscopic subacromial decompression, manipulation under anesthesia, mini-open rotator cuff repair using a [employer] Regeneten patch, and mini-open biceps tenodesis, left shoulder Left 07/23/2020 (Dr. PRoland Rack   NSVD 1984; 1982   Shoulder surgery Left ([facility]   Sinus/nasal surgery [place]  Family History:   Diabetes Father   Heart disease Father   Diabetes Sister   Diabetes Brother   Diabetes Paternal Aunt   Diabetes Paternal Uncle   Diabetes Maternal Grandmother   Diabetes Paternal Grandfather   Heart disease Mother   Social History:   Socioeconomic History:   Marital status: Married  Tobacco Use   Smoking status: Never Smoker  Smokeless tobacco: Never Used  Vaping Use   Vaping Use: Never used  Substance and Sexual Activity   Alcohol use: Not Currently  Alcohol/week: 0.0 standard drinks   Drug use: No   Sexual activity: Defer   Review of Systems:  A comprehensive 14 point ROS was performed, reviewed, and the pertinent orthopaedic findings are documented in the HPI.  Physical Exam: Vitals:  10/16/20 1054  BP: 116/78  Weight: 65.4 kg (144 lb 3.2 oz)  Height:  167.6 cm ('5\' 6"'$ )  PainSc: 0-No pain  PainLoc: Shoulder   General/Constitutional: The patient appears to be well-nourished, well-developed, and in no acute distress. Neuro/Psych: Normal mood and affect, oriented to person, place and time.  Eyes: Non-icteric. Pupils are equal, round, and reactive to light, and exhibit synchronous movement. ENT: Unremarkable. Lymphatic: No palpable adenopathy. Respiratory: Lungs clear to auscultation, Normal chest excursion, No wheezes and Non-labored breathing Cardiovascular: Regular rate and rhythm. No murmurs. and No edema, swelling or tenderness, except as noted in detailed exam. Integumentary: No impressive skin lesions present, except as noted in detailed exam. Musculoskeletal: Unremarkable, except as noted in detailed exam.  Left shoulder exam: Skin inspection of the left shoulder demonstrates her surgical incision and arthroscopic portal sites to be well-healed and without evidence for infection. No swelling, erythema, ecchymosis, abrasions, or other skin abnormalities are identified. She has no tenderness to palpation over the anterior or lateral aspects of the shoulder. Passively, she can tolerate forward flexion to 75 degrees and abduction to 60 degrees. At 60 degrees of abduction, she can tolerate external rotation to 20 degrees and internal rotation to 25 degrees. She exhibits 3-3+/5 strength with gentle resisted internal and external rotation, as well as with gentle resisted abduction with her arm at her side. She is neurovascularly intact to the left upper extremity and hand.  Assessment:  Tendinitis of upper biceps tendon of left shoulder   Nontraumatic incomplete tear of left rotator cuff   Adhesive capsulitis of left shoulder   Rotator cuff tendinitis, left   Carpal tunnel syndrome, left   Plan: The treatment options were discussed with the patient. In addition, patient educational materials were provided regarding the diagnosis and treatment  options. Regarding her left shoulder, the patient is becoming increasingly frustrated by her difficulty regaining her range of motion. Certainly, given her history of adhesive capsulitis, I am concerned that she is developing this again. Therefore, I have recommended that we proceed with a repeat manipulation under anesthesia with steroid injection of her left shoulder. The procedure was discussed with the patient, as were the potential risks (including bleeding, infection, nerve and/or blood vessel injury, persistent or recurrent pain, persistent or recurrent stiffness, humeral fracture, need for further surgery, blood clots, strokes, heart attacks and/or arhythmias, pneumonia, etc.) and benefits. The patient states her understanding and wishes to proceed. All of the patient's questions and concerns were answered.   H&P reviewed and patient re-examined. No changes.

## 2020-10-28 NOTE — Op Note (Signed)
10/28/2020  1:52 PM  Patient:   Andrea Buckley  Pre-Op Diagnosis:   Secondary adhesive capsulitis, left shoulder.  Post-Op Diagnosis:   Same  Procedure:   Manipulation under anesthesia with steroid injection, left shoulder.  Surgeon:   Pascal Lux, MD  Assistant:   None  Anesthesia:   IV sedation  Findings:   As above. Prior to manipulation, the left shoulder could be forward flexed to 130 and abducted to 125. At 90 of abduction, the shoulder could be externally rotated to 70 and internally rotated to 45. Following manipulation, the shoulder could be forward flexed to 160, abducted to 155 and, at 90 of abduction, externally rotated to 90 and internally rotated to 70.  Complications:   None  EBL:   0 cc  Fluids:   500 cc crystalloid  TT:   None  Drains:   None  Closure:   None  Brief Clinical Note:   The patient is a 70 year old female who is now 3 months status post a left shoulder arthroscopy with extensive debridement and manipulation under anesthesia secondary to primary adhesive capsulitis, decompression, mini-open rotator cuff repair, and biceps tenodesis. Despite extensive physical therapy, the patient continues to have difficulty regaining shoulder range of motion. The patient's history and examination are consistent with secondary adhesive capsulitis. The patient presents at this time for a manipulation under anesthesia with steroid injection of the left shoulder.  Procedure:   The patient was brought into the operating room and lain in the supine position. After adequate IV sedation was achieved, a timeout was performed to verify the correct surgical site. The left shoulder was gently manipulated in both abduction and external rotation, as well as adduction and internal rotation. Several palpable and audible pops were heard as the scar tissue released, permitting full range of motion of the shoulder. The glenohumeral joint was injected sterilely using 1 cc of  Kenalog-40 and 9 cc of 0.25% Sensorcaine with epinephrine. The patient was then awakened and returned to the recovery room in satisfactory condition after tolerating the procedure well.

## 2020-10-29 ENCOUNTER — Encounter: Payer: Self-pay | Admitting: Surgery

## 2020-10-29 NOTE — Anesthesia Postprocedure Evaluation (Signed)
Anesthesia Post Note  Patient: BRIGETTE BURROUS  Procedure(s) Performed: LEFT SHOULDER MANIPULATION UNDER ANESTHESIA WITH STEROID INJECTION. (Left: Shoulder)  Patient location during evaluation: PACU Anesthesia Type: General Level of consciousness: awake and alert Pain management: pain level controlled Vital Signs Assessment: post-procedure vital signs reviewed and stable Respiratory status: spontaneous breathing, nonlabored ventilation, respiratory function stable and patient connected to nasal cannula oxygen Cardiovascular status: blood pressure returned to baseline and stable Postop Assessment: no apparent nausea or vomiting Anesthetic complications: no   No notable events documented.   Last Vitals:  Vitals:   10/28/20 1511 10/28/20 1513  BP: (!) (P) 141/51 (!) 141/51  Pulse: (P) 69 72  Resp: (P) 18 18  Temp: (!) (P) 36.3 C (!) 36.3 C  SpO2: (P) 100% 100%    Last Pain:  Vitals:   10/28/20 1513  TempSrc: Temporal  PainSc: 6                  Martha Clan

## 2022-03-03 IMAGING — MG MM DIGITAL SCREENING BILAT W/ TOMO AND CAD
6 of 10 series · 6 of 30 positions shown · non-contrast
Comparison: Previous exam(s).

CLINICAL DATA: Screening.

EXAM:
DIGITAL SCREENING BILATERAL MAMMOGRAM WITH TOMOSYNTHESIS AND CAD
TECHNIQUE: Bilateral screening digital craniocaudal and mediolateral oblique
mammograms were obtained. Bilateral screening digital breast
tomosynthesis was performed. The images were evaluated with
computer-aided detection.

[L MLO synth-2D]
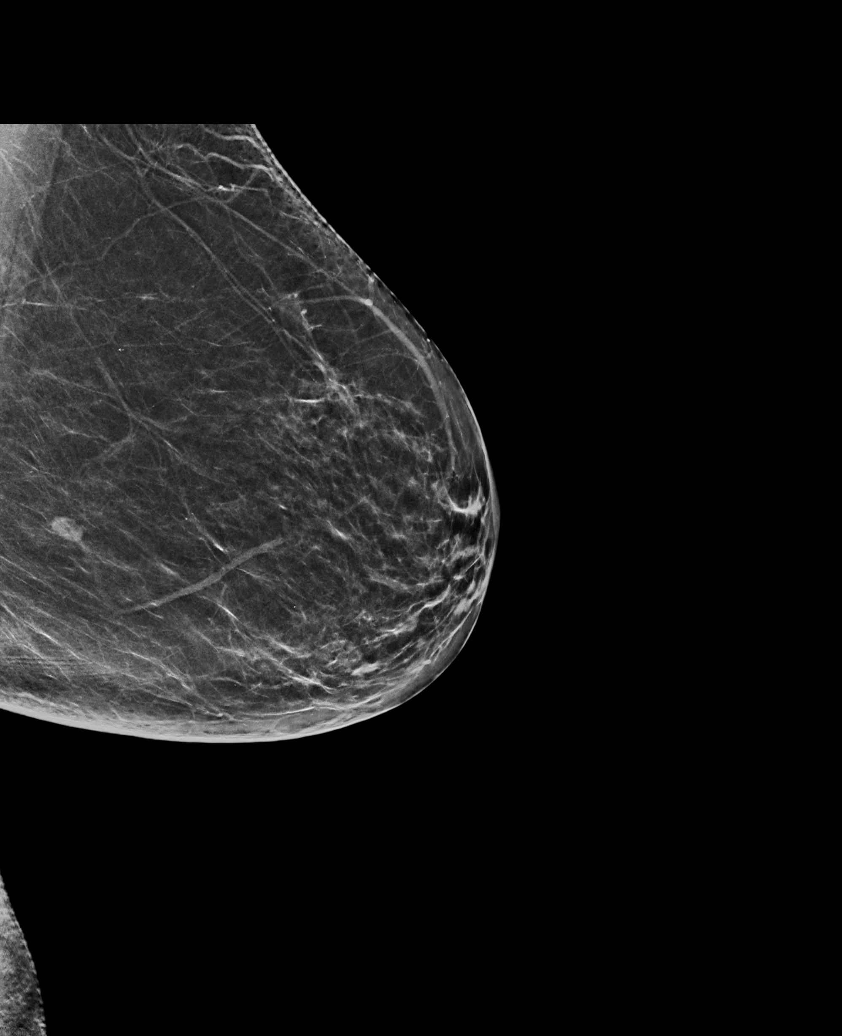

[R MLO synth-2D (1 of 2)]
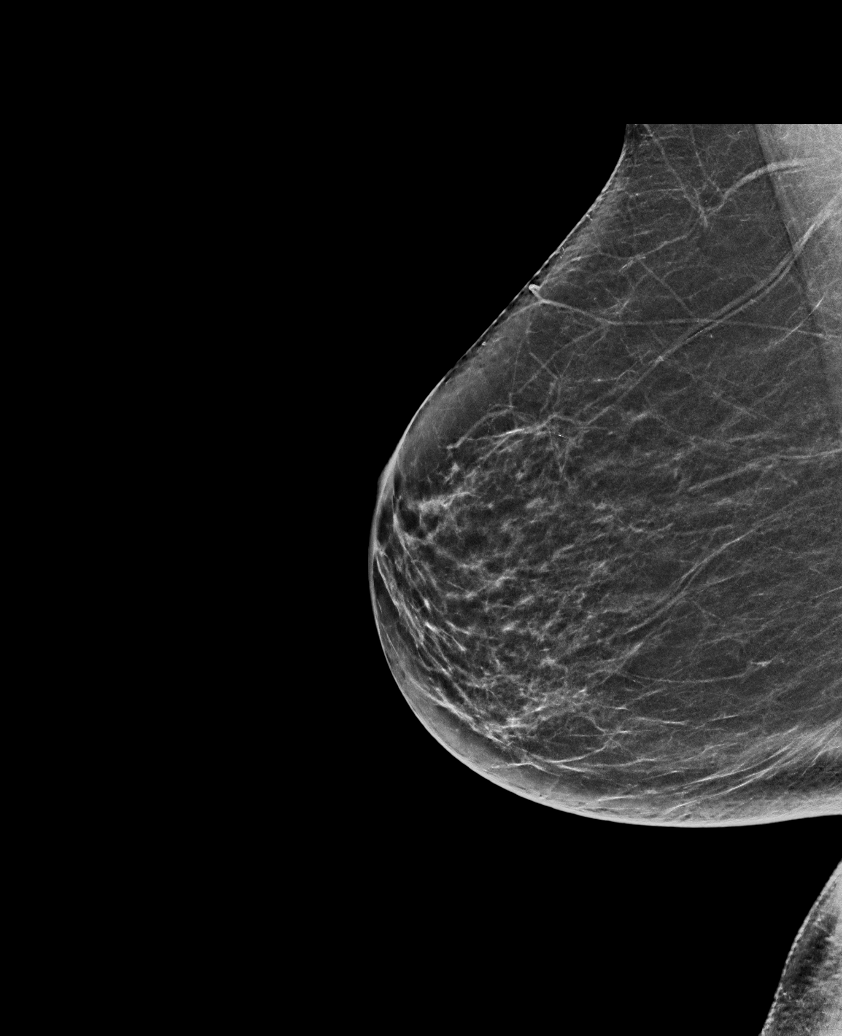

[R CC synth-2D]
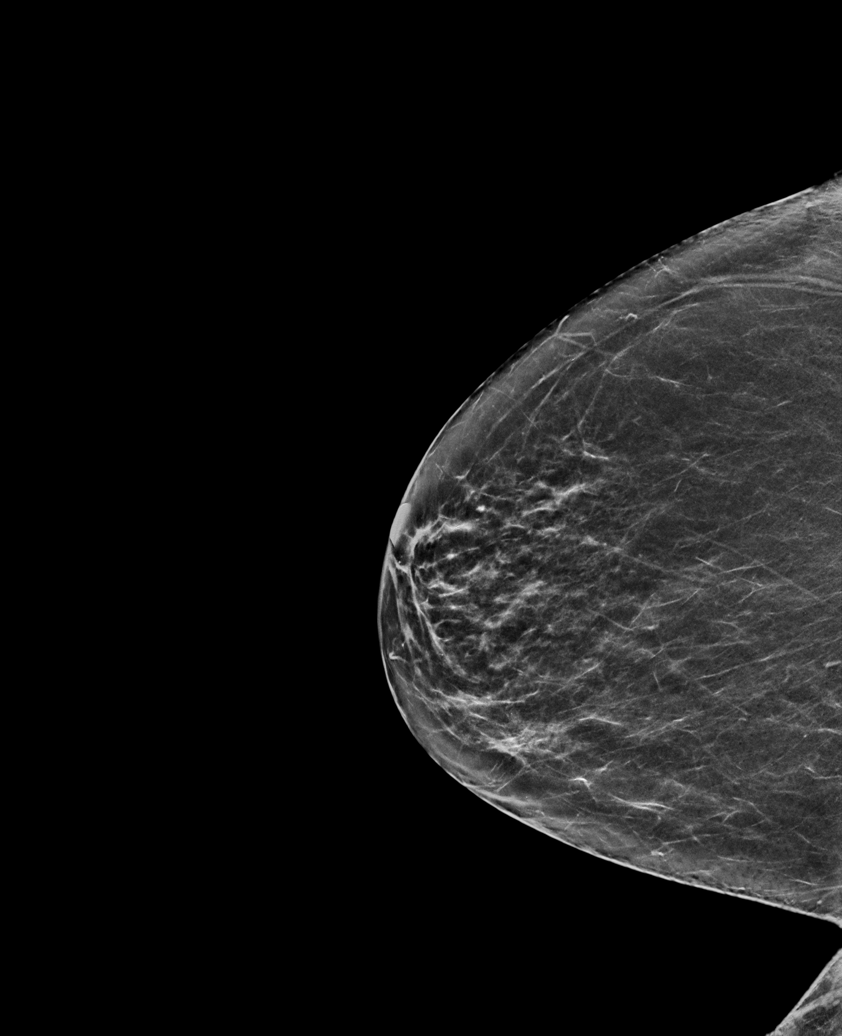

[L CC synth-2D]
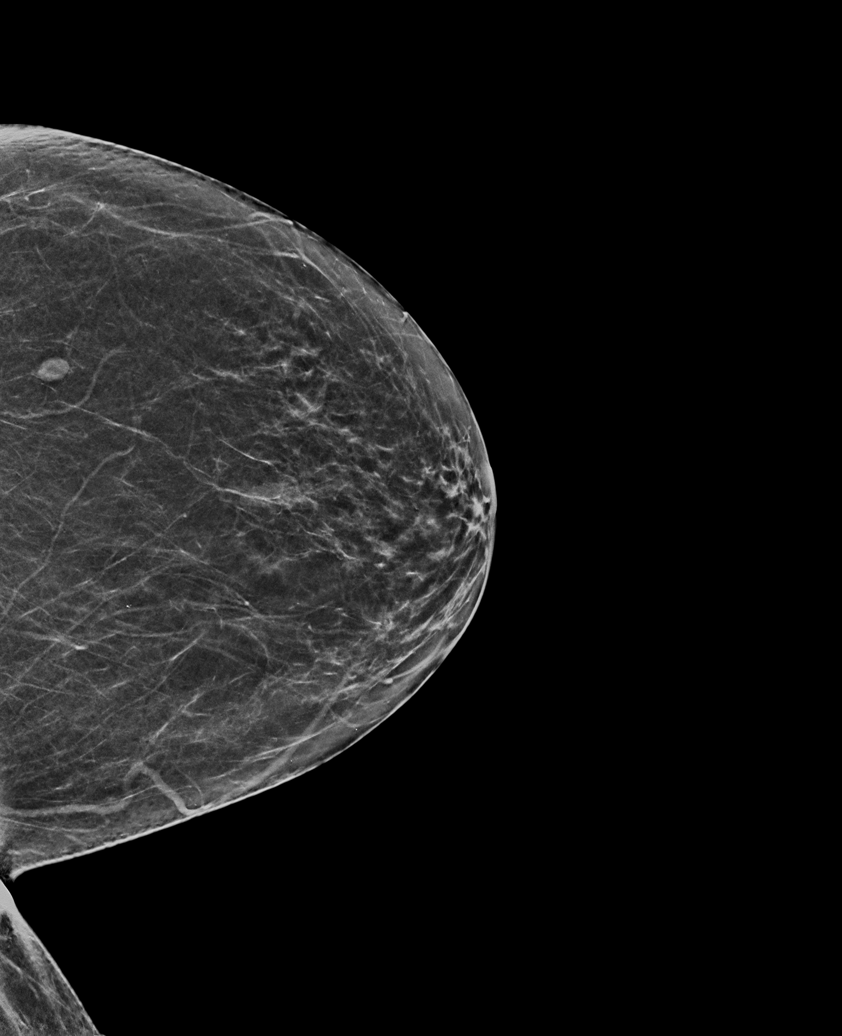

[R MLO synth-2D (2 of 2)]
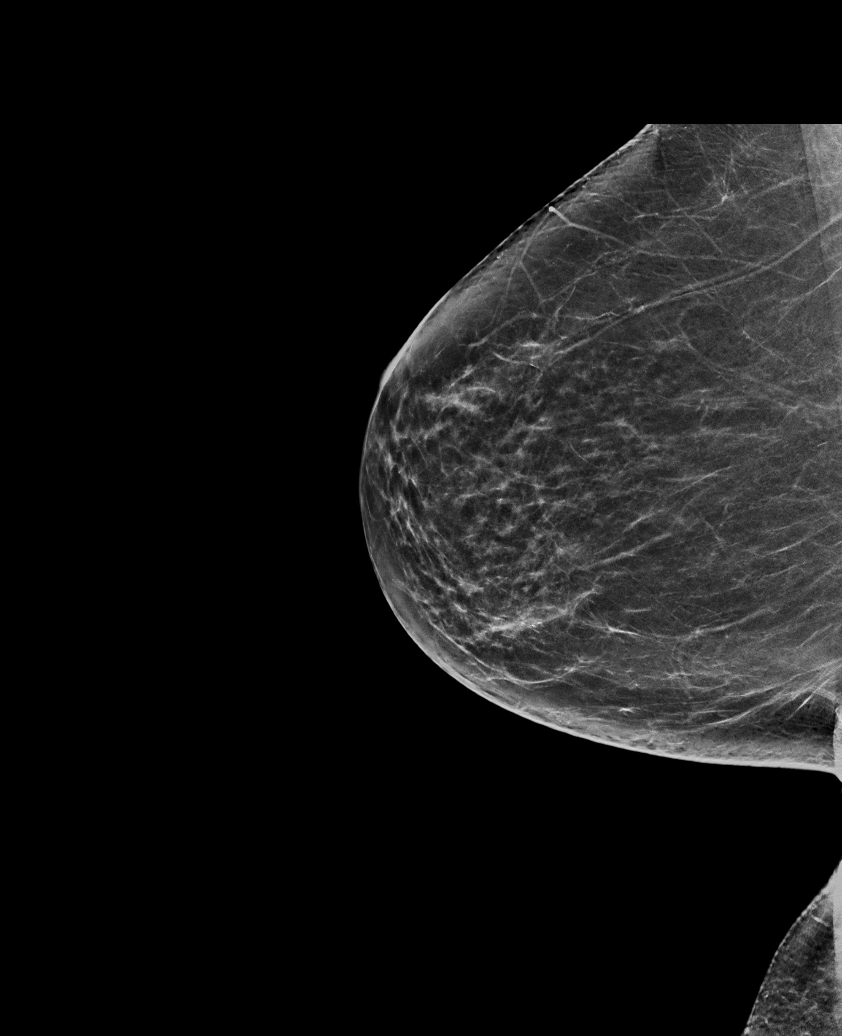

[R MLO tomo · tomo slice 31/62.0]
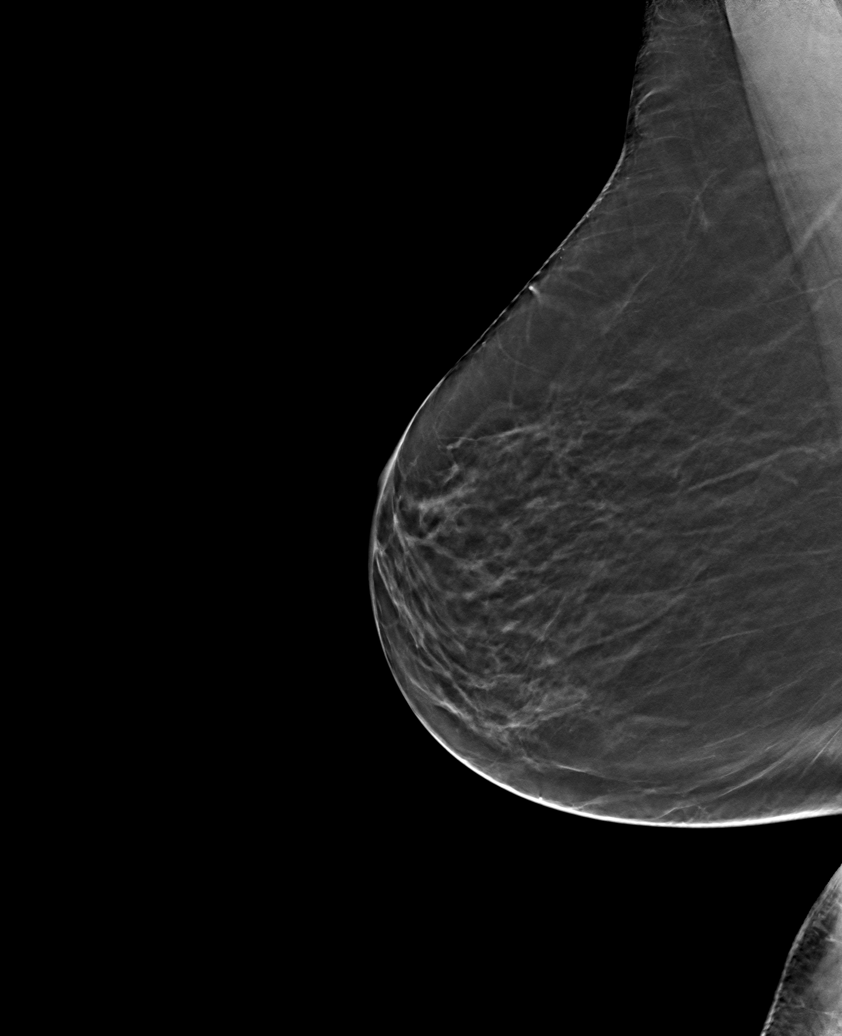

[6 of 30 positions shown; findings below may reference images not displayed]

ACR Breast Density Category b: There are scattered areas of
fibroglandular density.
FINDINGS: There are no findings suspicious for malignancy. The images were
evaluated with computer-aided detection.
IMPRESSION: No mammographic evidence of malignancy. A result letter of this
screening mammogram will be mailed directly to the patient.

RECOMMENDATION:
Screening mammogram in one year. (Code:WJ-I-BG6)

BI-RADS CATEGORY  1: Negative.

## 2022-06-07 ENCOUNTER — Other Ambulatory Visit: Payer: Self-pay | Admitting: Family Medicine

## 2022-06-07 DIAGNOSIS — Z1231 Encounter for screening mammogram for malignant neoplasm of breast: Secondary | ICD-10-CM

## 2022-06-13 ENCOUNTER — Ambulatory Visit
Admission: RE | Admit: 2022-06-13 | Discharge: 2022-06-13 | Disposition: A | Discharge status: Home / Self Care | Payer: Medicare HMO | Source: Ambulatory Visit | Attending: Family Medicine | Admitting: Family Medicine

## 2022-06-13 DIAGNOSIS — Z1231 Encounter for screening mammogram for malignant neoplasm of breast: Secondary | ICD-10-CM | POA: Insufficient documentation

## 2023-04-26 DIAGNOSIS — N390 Urinary tract infection, site not specified: Secondary | ICD-10-CM | POA: Diagnosis not present

## 2023-04-26 DIAGNOSIS — R399 Unspecified symptoms and signs involving the genitourinary system: Secondary | ICD-10-CM | POA: Diagnosis not present

## 2023-05-30 DIAGNOSIS — H2513 Age-related nuclear cataract, bilateral: Secondary | ICD-10-CM | POA: Diagnosis not present

## 2023-05-30 DIAGNOSIS — E119 Type 2 diabetes mellitus without complications: Secondary | ICD-10-CM | POA: Diagnosis not present

## 2023-05-30 DIAGNOSIS — H538 Other visual disturbances: Secondary | ICD-10-CM | POA: Diagnosis not present

## 2023-06-26 DIAGNOSIS — E1159 Type 2 diabetes mellitus with other circulatory complications: Secondary | ICD-10-CM | POA: Diagnosis not present

## 2023-06-26 DIAGNOSIS — E785 Hyperlipidemia, unspecified: Secondary | ICD-10-CM | POA: Diagnosis not present

## 2023-06-26 DIAGNOSIS — E890 Postprocedural hypothyroidism: Secondary | ICD-10-CM | POA: Diagnosis not present

## 2023-06-26 DIAGNOSIS — I152 Hypertension secondary to endocrine disorders: Secondary | ICD-10-CM | POA: Diagnosis not present

## 2023-06-26 DIAGNOSIS — E119 Type 2 diabetes mellitus without complications: Secondary | ICD-10-CM | POA: Diagnosis not present

## 2023-06-26 DIAGNOSIS — E1169 Type 2 diabetes mellitus with other specified complication: Secondary | ICD-10-CM | POA: Diagnosis not present

## 2023-08-23 DIAGNOSIS — I10 Essential (primary) hypertension: Secondary | ICD-10-CM | POA: Diagnosis not present

## 2023-08-23 DIAGNOSIS — E890 Postprocedural hypothyroidism: Secondary | ICD-10-CM | POA: Diagnosis not present

## 2023-08-23 DIAGNOSIS — E119 Type 2 diabetes mellitus without complications: Secondary | ICD-10-CM | POA: Diagnosis not present

## 2023-08-23 DIAGNOSIS — E785 Hyperlipidemia, unspecified: Secondary | ICD-10-CM | POA: Diagnosis not present

## 2023-08-23 DIAGNOSIS — F4321 Adjustment disorder with depressed mood: Secondary | ICD-10-CM | POA: Diagnosis not present

## 2023-08-23 DIAGNOSIS — Z0000 Encounter for general adult medical examination without abnormal findings: Secondary | ICD-10-CM | POA: Diagnosis not present

## 2023-08-28 ENCOUNTER — Ambulatory Visit: Admit: 2023-08-28 | Admitting: Ophthalmology

## 2023-08-28 SURGERY — PHACOEMULSIFICATION, CATARACT, WITH IOL INSERTION
Anesthesia: Topical | Laterality: Right

## 2023-09-11 ENCOUNTER — Ambulatory Visit: Admit: 2023-09-11 | Admitting: Ophthalmology

## 2023-09-11 SURGERY — PHACOEMULSIFICATION, CATARACT, WITH IOL INSERTION
Anesthesia: Topical | Laterality: Left

## 2023-10-16 ENCOUNTER — Other Ambulatory Visit: Payer: Self-pay | Admitting: Family Medicine

## 2023-10-16 DIAGNOSIS — Z1231 Encounter for screening mammogram for malignant neoplasm of breast: Secondary | ICD-10-CM

## 2023-10-30 ENCOUNTER — Ambulatory Visit
Admission: RE | Admit: 2023-10-30 | Discharge: 2023-10-30 | Disposition: A | Discharge status: Home / Self Care | Source: Ambulatory Visit | Attending: Family Medicine | Admitting: Family Medicine

## 2023-10-30 DIAGNOSIS — Z1231 Encounter for screening mammogram for malignant neoplasm of breast: Secondary | ICD-10-CM | POA: Diagnosis not present

## 2023-11-23 DIAGNOSIS — J40 Bronchitis, not specified as acute or chronic: Secondary | ICD-10-CM | POA: Diagnosis not present

## 2023-11-23 DIAGNOSIS — E119 Type 2 diabetes mellitus without complications: Secondary | ICD-10-CM | POA: Diagnosis not present

## 2023-11-28 DIAGNOSIS — E785 Hyperlipidemia, unspecified: Secondary | ICD-10-CM | POA: Diagnosis not present

## 2023-11-28 DIAGNOSIS — I10 Essential (primary) hypertension: Secondary | ICD-10-CM | POA: Diagnosis not present

## 2024-01-08 DIAGNOSIS — E785 Hyperlipidemia, unspecified: Secondary | ICD-10-CM | POA: Diagnosis not present

## 2024-01-08 DIAGNOSIS — E1159 Type 2 diabetes mellitus with other circulatory complications: Secondary | ICD-10-CM | POA: Diagnosis not present

## 2024-01-08 DIAGNOSIS — Z79899 Other long term (current) drug therapy: Secondary | ICD-10-CM | POA: Diagnosis not present

## 2024-01-08 DIAGNOSIS — I152 Hypertension secondary to endocrine disorders: Secondary | ICD-10-CM | POA: Diagnosis not present

## 2024-01-08 DIAGNOSIS — E119 Type 2 diabetes mellitus without complications: Secondary | ICD-10-CM | POA: Diagnosis not present

## 2024-01-08 DIAGNOSIS — E1169 Type 2 diabetes mellitus with other specified complication: Secondary | ICD-10-CM | POA: Diagnosis not present

## 2024-01-08 DIAGNOSIS — R300 Dysuria: Secondary | ICD-10-CM | POA: Diagnosis not present

## 2024-01-08 DIAGNOSIS — E890 Postprocedural hypothyroidism: Secondary | ICD-10-CM | POA: Diagnosis not present

## 2024-03-13 NOTE — Progress Notes (Addendum)
 Andrea Buckley                                          MRN: [id number]   03/13/2024   The [facility] Quality Team Specialist reviewed this patient medical record for the purposes of chart review for care gap closure. The following were reviewed: abstraction for care gap closure-glycemic status assessment. Chart reviewed for KED labs as well.  03/21/2024- cannot close KED 2025    [facility] Quality Team
